# Patient Record
Sex: Male | Born: 1935 | Race: Black or African American | Hispanic: No | Marital: Married | Smoking: Former smoker
Health system: Southern US, Community
[De-identification: ages and names within clinical notes are randomized; demographics above are authoritative.]

## PROBLEM LIST (undated history)

## (undated) DIAGNOSIS — N289 Disorder of kidney and ureter, unspecified: Secondary | ICD-10-CM

## (undated) DIAGNOSIS — D573 Sickle-cell trait: Secondary | ICD-10-CM

## (undated) DIAGNOSIS — G4730 Sleep apnea, unspecified: Secondary | ICD-10-CM

## (undated) DIAGNOSIS — E7800 Pure hypercholesterolemia, unspecified: Secondary | ICD-10-CM

## (undated) DIAGNOSIS — Z950 Presence of cardiac pacemaker: Secondary | ICD-10-CM

## (undated) DIAGNOSIS — I10 Essential (primary) hypertension: Secondary | ICD-10-CM

## (undated) DIAGNOSIS — I4891 Unspecified atrial fibrillation: Secondary | ICD-10-CM

## (undated) DIAGNOSIS — I499 Cardiac arrhythmia, unspecified: Secondary | ICD-10-CM

## (undated) DIAGNOSIS — E119 Type 2 diabetes mellitus without complications: Secondary | ICD-10-CM

## (undated) DIAGNOSIS — I442 Atrioventricular block, complete: Secondary | ICD-10-CM

## (undated) DIAGNOSIS — J45909 Unspecified asthma, uncomplicated: Secondary | ICD-10-CM

## (undated) HISTORY — DX: Type 2 diabetes mellitus without complications: E11.9

## (undated) HISTORY — DX: Sickle-cell trait: D57.3

## (undated) HISTORY — DX: Pure hypercholesterolemia, unspecified: E78.00

## (undated) HISTORY — DX: Cardiac arrhythmia, unspecified: I49.9

## (undated) HISTORY — DX: Essential (primary) hypertension: I10

## (undated) HISTORY — DX: Sleep apnea, unspecified: G47.30

## (undated) HISTORY — DX: Disorder of kidney and ureter, unspecified: N28.9

## (undated) HISTORY — DX: Unspecified asthma, uncomplicated: J45.909

---

## 1994-07-14 HISTORY — PX: OTHER SURGICAL HISTORY: SHX169

## 1995-03-14 HISTORY — PX: OTHER SURGICAL HISTORY: SHX169

## 1997-03-13 HISTORY — PX: PACEMAKER INSERTION: SHX728

## 1997-08-13 HISTORY — PX: PENILE PROSTHESIS IMPLANT: SHX240

## 1997-08-28 ENCOUNTER — Observation Stay (HOSPITAL_COMMUNITY): Admission: RE | Admit: 1997-08-28 | Discharge: 1997-08-29 | Payer: Self-pay | Admitting: Urology

## 1999-06-14 HISTORY — PX: OTHER SURGICAL HISTORY: SHX169

## 2000-04-16 ENCOUNTER — Encounter: Payer: Self-pay | Admitting: Internal Medicine

## 2000-04-16 ENCOUNTER — Encounter: Admission: RE | Admit: 2000-04-16 | Discharge: 2000-04-16 | Payer: Self-pay | Admitting: Internal Medicine

## 2003-12-21 ENCOUNTER — Ambulatory Visit: Payer: Self-pay

## 2004-04-19 ENCOUNTER — Ambulatory Visit: Payer: Self-pay | Admitting: Internal Medicine

## 2004-04-24 ENCOUNTER — Ambulatory Visit: Payer: Self-pay | Admitting: Internal Medicine

## 2005-07-18 ENCOUNTER — Ambulatory Visit: Payer: Self-pay | Admitting: Internal Medicine

## 2006-08-03 ENCOUNTER — Ambulatory Visit: Payer: Self-pay | Admitting: Internal Medicine

## 2006-11-09 ENCOUNTER — Ambulatory Visit: Payer: Self-pay

## 2006-11-14 HISTORY — PX: OTHER SURGICAL HISTORY: SHX169

## 2006-12-02 ENCOUNTER — Ambulatory Visit: Payer: Self-pay | Admitting: Internal Medicine

## 2006-12-02 LAB — CONVERTED CEMR LAB
BUN: 43 mg/dL — ABNORMAL HIGH (ref 6–23)
Basophils Absolute: 0 10*3/uL (ref 0.0–0.1)
Basophils Relative: 0.7 % (ref 0.0–1.0)
CO2: 25 meq/L (ref 19–32)
Calcium: 9.6 mg/dL (ref 8.4–10.5)
Chloride: 106 meq/L (ref 96–112)
Creatinine, Ser: 1.5 mg/dL (ref 0.4–1.5)
Eosinophils Absolute: 0.2 10*3/uL (ref 0.0–0.6)
Eosinophils Relative: 5 % (ref 0.0–5.0)
GFR calc Af Amer: 59 mL/min
GFR calc non Af Amer: 49 mL/min
Glucose, Bld: 119 mg/dL — ABNORMAL HIGH (ref 70–99)
HCT: 36.5 % — ABNORMAL LOW (ref 39.0–52.0)
Hemoglobin: 11.9 g/dL — ABNORMAL LOW (ref 13.0–17.0)
INR: 2.5 — ABNORMAL HIGH (ref 0.8–1.0)
Lymphocytes Relative: 26.6 % (ref 12.0–46.0)
MCHC: 32.7 g/dL (ref 30.0–36.0)
MCV: 72.2 fL — ABNORMAL LOW (ref 78.0–100.0)
Monocytes Absolute: 0.5 10*3/uL (ref 0.2–0.7)
Monocytes Relative: 11.5 % — ABNORMAL HIGH (ref 3.0–11.0)
Neutro Abs: 2.7 10*3/uL (ref 1.4–7.7)
Neutrophils Relative %: 56.2 % (ref 43.0–77.0)
Platelets: 147 10*3/uL — ABNORMAL LOW (ref 150–400)
Potassium: 3.8 meq/L (ref 3.5–5.1)
Prothrombin Time: 19.8 s — ABNORMAL HIGH (ref 10.9–13.3)
RBC: 5.06 M/uL (ref 4.22–5.81)
RDW: 15.6 % — ABNORMAL HIGH (ref 11.5–14.6)
Sodium: 140 meq/L (ref 135–145)
WBC: 4.6 10*3/uL (ref 4.5–10.5)
aPTT: 40.5 s — ABNORMAL HIGH (ref 21.7–29.8)

## 2006-12-08 ENCOUNTER — Ambulatory Visit (HOSPITAL_COMMUNITY): Admission: RE | Admit: 2006-12-08 | Discharge: 2006-12-08 | Payer: Self-pay | Admitting: Internal Medicine

## 2006-12-08 ENCOUNTER — Ambulatory Visit: Payer: Self-pay | Admitting: Internal Medicine

## 2006-12-23 ENCOUNTER — Ambulatory Visit: Payer: Self-pay

## 2008-03-13 HISTORY — PX: CIRCUMCISION: SUR203

## 2008-05-15 ENCOUNTER — Ambulatory Visit (HOSPITAL_COMMUNITY): Admission: RE | Admit: 2008-05-15 | Discharge: 2008-05-15 | Payer: Self-pay | Admitting: Urology

## 2010-04-23 LAB — PROTIME-INR
INR: 1.2 (ref 0.00–1.49)
Prothrombin Time: 15.3 seconds — ABNORMAL HIGH (ref 11.6–15.2)

## 2010-04-23 LAB — APTT: aPTT: 32 seconds (ref 24–37)

## 2010-04-24 LAB — BASIC METABOLIC PANEL
BUN: 28 mg/dL — ABNORMAL HIGH (ref 6–23)
Chloride: 109 mEq/L (ref 96–112)
Glucose, Bld: 156 mg/dL — ABNORMAL HIGH (ref 70–99)
Potassium: 3.7 mEq/L (ref 3.5–5.1)

## 2010-04-24 LAB — HEMOGLOBIN AND HEMATOCRIT, BLOOD: Hemoglobin: 11.7 g/dL — ABNORMAL LOW (ref 13.0–17.0)

## 2010-05-28 NOTE — Letter (Signed)
August 03, 2006    Abner Greenspan, M.D.  [address]  [place], [place]  [address]   RE:  Bradley Rasmussen, Bradley Rasmussen  MRN:  [id number]  /  DOB:  [birth year]   Dear [name]:   I hope this letter finds you and your family well.  [name] comes in  today, he is status post pacemaker implantation for a complete heart  block in the setting ofpermanent atrial fibrillation.  He has no  complaints of chest pain or shortness of breath. Despite the notes from  the nurses saying shortness of breath with exertion, he at least denies  that with me.  He did have an episode of transient right upper shoulder  discomfort.  He thinks this is vaguely similar to something that he has  had before.  It was quite brief.  He does not know what he was doing  when it happened.   His medications have been recently adjusted with a discontinuation of  amlodipine because of edema.  He is now on felodipine, lisinopril,  atenolol, allopurinol, furosemide, __________ , statin and Coumadin.   On examination is blood pressure was 160/96, his pulse was 65.  LUNGS:  Clear.  HEART:  Sounds were regular.  EXTREMITIES:  Were without edema.   Interrogation of his St. Jude pulse generator demonstrates that he has  an estimated longevity of about 3 months.  His ventricular ventricular  capture threshold is 1 V at 0.4with an RV impedance of 561.  There is no  intrinsic ventricular rhythm.   IMPRESSION:  1. Atrial fibrillation.  2. Complete heart block with no intrinsic ventricular rhythm.  3. Status post pacer for number 1 and number 2.  4. Systolic hypertension.   As [name] is going to see you in the next couple of days, we will not  make any adjustments on his blood pressure medications today.  We will  plan to see him again in about 3 months as he approaches device  generator replacement.  We were also reviewing with him whether he would  like to have his device changed here or in [place], with the [place]  [facility] and  we have encouraged him to do whatever it is that he  feels most comfortable doing.   Thanks you for allowing [place] to participate in his care.    Sincerely,      Duke Salvia, MD, [facility]  Electronically Signed    SCK/MedQ  DD: 08/03/2006  DT: 08/03/2006  Job #: [id number]   CC:    Florian Buff, MD

## 2010-05-28 NOTE — Op Note (Signed)
NAME:  Bradley Rasmussen, Bradley Rasmussen                  ACCOUNT NO.:  [id number]   MEDICAL RECORD NO.:  [id number]          PATIENT TYPE:  OIB   LOCATION:  2854                         FACILITY:  [facility]   PHYSICIAN:  Duke Salvia, MD, FACCDATE OF BIRTH:  Jun 06, 1931   DATE OF PROCEDURE:  12/08/2006  DATE OF DISCHARGE:                               OPERATIVE REPORT   PREOPERATIVE DIAGNOSIS:  Bradycardia prone atrial fibrillation,  pacemaker end of life.   POSTOPERATIVE DIAGNOSIS:  Bradycardia prone atrial fibrillation,  pacemaker end of life.   PROCEDURE:  Explantation of a previously implanted pacemaker, insertion  of a new single chamber pulse generator.   Following obtaining informed consent, Mr. Holtsclaw was brought to the  catheterization laboratory and placed on the fluoroscopic table in  supine position.  After routine prep and drape, lidocaine was  infiltrated in the line of the previous incision and an incision was  made and carried down to layer of the device pocket using sharp  dissection and electrocautery.  The pocket was opened.  The previously  implanted device was freed up and explanted.  Interrogation of the  chronic RV lead demonstrated an RV amplitude of 14.7 with impedance of  578, a threshold of 0.6 V at 0.5 milliseconds.  Current threshold 0.4  MA.  The lead was a 1346 Pacesetter lead serial number [id number].  This  lead was then attached to a St. Jude Zephyr XL pulse generator model  [id number] serial number [id number].  Pacing was identified.  The pocket was  copiously irrigated with antibiotic containing saline solution.  We  noted that there was a lot of antibiotic on the floor and not quite sure  where the leak was.  The wound was then closed in two layers in normal  fashion.  Wound was washed and dried and a benzoin Steri-Strip dressing  was applied.  Needle counts, sponge counts and instrument counts were  correct at the end of the procedure according to staff.  The patient  tolerated  the procedure without apparent complication.      Duke Salvia, MD, [facility]  Electronically Signed     SCK/MEDQ  D:  12/08/2006  T:  12/08/2006  Job:  [id number]

## 2010-05-28 NOTE — Op Note (Signed)
NAME:  Bradley Rasmussen, Bradley Rasmussen                  ACCOUNT NO.:  [id number]   MEDICAL RECORD NO.:  [id number]          PATIENT TYPE:  AMB   LOCATION:  DAY                          FACILITY:  [facility]   PHYSICIAN:  Sigmund I. Patsi Sears, M.D.DATE OF BIRTH:  1931-02-04   DATE OF PROCEDURE:  05/15/2008  DATE OF DISCHARGE:                               OPERATIVE REPORT   PREOPERATIVE DIAGNOSIS:  Severe phimosis.   POSTOPERATIVE DIAGNOSIS:  Severe phimosis.   OPERATION:  Circumcision.   SURGEON:  Tannenbaum   ANESTHESIA:  General LMA.   PREPARATION:  After appropriate preanesthesia, the patient is brought to  the operating room, placed on the operating room table in the dorsal  supine position where general LMA anesthesia was induced.  He remained  in the supine position where penis was prepped with Betadine solution  and draped in usual fashion.   REVIEW OF HISTORY:  This 77 year old male has history of BPH, and ED  (inflatable penile prosthesis, 1999), has been seen for severe tight  foreskin, which cracks when he retracts it over the glans.  This has  been steadily getting worse since October 2009.  Note that the patient  has a history of thalassemia trait, atrial fibrillation, COPD, diabetes,  gout, and sickle cell trait.   The patient has been on Lovenox bridging by Dr. Yetta Flock.   PROCEDURE:  An outline is made of the very tight foreskin, and  circumcising incision was made around the glans.  Pericoronal marking is  accomplished, and pericoronal incision was made.  Dissection of the very  tight and cracked foreskin was then accomplished.  The right four corner  repair was accomplished with 4-0 Monocryl suture.  Each quadrant is then  closed with 4-0 Monocryl suture.  Sterile dressing applied, and  following this, the patient was awakened and recovered in good  condition.      Sigmund I. Patsi Sears, M.D.  Electronically Signed     SIT/MEDQ  D:  05/15/2008  T:  05/15/2008  Job:   [id number]   cc:   Abner Greenspan, MD  [address]  [place], [place]

## 2010-05-28 NOTE — Discharge Summary (Signed)
NAME:  Bradley Rasmussen, Bradley Rasmussen                  ACCOUNT NO.:  [id number]   MEDICAL RECORD NO.:  [id number]          PATIENT TYPE:  OIB   LOCATION:  2854                         FACILITY:  [facility]   PHYSICIAN:  Duke Salvia, MD, FACCDATE OF BIRTH:  July 29, 1931   DATE OF ADMISSION:  12/08/2006  DATE OF DISCHARGE:  12/08/2006                               DISCHARGE SUMMARY   ALLERGIES:  AMOXICILLIN.   Dictation greater than 25 minutes.   FINAL DIAGNOSES:  1. Pacemaker at elective replacement indicator.  2. Explant of St. Librarian, academic with implant of St. Jude      Zephyr XL SR  single-chamber pacemaker.   SECONDARY DIAGNOSES:  1. Atrial fibrillation, chronic Coumadin therapy.  2. Complete heart block.  3. Nonischemic cardiomyopathy.  4. Left heart catheterization in 1997  5. New York Heart Association class II-III.  6. Chronic systolic congestive heart failure.  7. Hypertension.  8. Gout.  9. Right bundle branch block.  10.Borderline diabetes.   PROCEDURE:  December 08, 2006, explantation of existing single-chamber  pacemaker with implant of St. Jude Zephyr XL SR single-chamber  pacemaker, Dr. Sherryl Manges.  The patient had no post-procedural  complications and discharging the same day.  No hematoma.   DISCHARGE INSTRUCTIONS:  1. Remove the bandage on Wednesday, December 09, 2006 and leave the      incision open to the air.  2. He is to keep the incision dry for the next 7 days and to sponge      bathe until Tuesday, December 15, 2006.  3. He has a follow up appointment with [facility] on December 09, 2006, [place].  4. To see the pacer clinic Wednesday December 23, 2006 at 9:40.   DISCHARGE MEDICATIONS:  1. Lisinopril 40 mg daily.  2. Felodipine 5 mg daily.  3. Allopurinol 300 mg daily.  4. Furosemide 40 mg daily.  5. Enteric-coated aspirin 81 mg daily.  6. Doxazosin 8 mg daily.  7. Simvastatin 20 mg daily at bedtime.  8. Carvedilol 12.5 mg  twice daily.  9. Allegra 180 mg daily.  10.Fluticasone nasal spray as before this admission.  11.Coumadin as before this hospital visit   BRIEF HISTORY:  Mr. Churchill is a 77 year old male.  He has atrial  fibrillation with complete heart block.  He has no intrinsic ventricular  rhythm.  He has some dizziness with ambulation, but his dyspnea is  fairly stable.  He can climb 1 flight of stairs, but has to rest after  that.  Walking on level ground is not a problem for him.   Mr. Tippett will need pacemaker generator replacement.  The benefits and  risks have been reviewed, and the patient wishes to proceed.  He will  present electively   HOSPITAL COURSE:  The patient presents December 08, 2006.  He underwent  explantation of existing St. Jude device with implantation of the St.  Jude Zephyr XL SR device.  The patient had post-procedural interrogation  of the device with values within normal limits.  LABORATORY DATA:  White cells 4.6, hemoglobin 11.9, hematocrit 36.5,  platelets of 147, pro-time this admission is 21.0, INR 1.8.  Serum  electrolytes:  Sodium is 140, potassium 3.8, chloride 106, carbonate 25,  glucose 119, BUN is 43, creatinine 1.5.      Maple Mirza, [place]      Duke Salvia, MD, [facility]  Electronically Signed    GM/MEDQ  D:  12/08/2006  T:  12/08/2006  Job:  [id number]   cc:   Duke Salvia, MD, [facility]

## 2010-05-28 NOTE — Assessment & Plan Note (Signed)
[facility]                         ELECTROPHYSIOLOGY OFFICE NOTE   ARI, BERNABEI                           MRN:          [id number]  DATE:12/02/2006                            DOB:          22-Aug-1931    Mr. Bradley Rasmussen was seen yesterday.  He has atrial fibrillation with complete  heart block and no intrinsic ventricular rhythm.  His shortness of  breath is stable but he is having some dizziness with ambulation.   MEDICATIONS:  1. Carvedilol 12.5 b.i.d.  2. Simvastatin.  3. Warfarin  4. Lisinopril 40.  5. Felodipine 5.  6. Furosemide 40.  7. Aspirin.   PHYSICAL EXAMINATION:  VITAL SIGNS:  His blood pressure was pretty well  controlled at 143/85.  His pulse was 85.  LUNGS:  Clear.  HEART:  Sounds were regular.  EXTREMITIES:  Without edema.  SKIN:  Warm and dry.   Interrogation of his St. Jude pulse generator demonstrates that he is  very closely approaching ERI.  His battery voltage is 2.52.  His  impedance is greater than 23 kilo ohms.  His ventricular lead impedance  is 612.  Threshold was 1 volt at 0.4.  He does have an intrinsic rhythm  at about 30 beats per minute.   IMPRESSION:  1. High grade heart block.  2. Status post pacemaker for the above, now approaching elective      replacement interval.  3. Hypertension.  4. Chronic atrial fibrillation.   Mr. Bradley Rasmussen will need to undergo pacemaker generator replacement.  We have  reviewed the potential benefits as well as the potential risks,  including but not limited to, infection.  Given the fact that his  underlying rate will be at 30, we will plan not to place a temporary  transvenous pacer.     Duke Salvia, MD, [facility]  Electronically Signed   SCK/MedQ  DD: 12/03/2006  DT: 12/03/2006  Job #: [id number]

## 2010-10-22 LAB — PROTIME-INR: INR: 1.8 — ABNORMAL HIGH

## 2011-10-14 LAB — PULMONARY FUNCTION TEST

## 2013-01-31 ENCOUNTER — Ambulatory Visit (INDEPENDENT_AMBULATORY_CARE_PROVIDER_SITE_OTHER)
Admission: RE | Admit: 2013-01-31 | Discharge: 2013-01-31 | Disposition: A | Discharge status: Home / Self Care | Payer: Medicare HMO | Source: Ambulatory Visit | Attending: Pulmonary Disease | Admitting: Pulmonary Disease

## 2013-01-31 ENCOUNTER — Other Ambulatory Visit (INDEPENDENT_AMBULATORY_CARE_PROVIDER_SITE_OTHER): Payer: Medicare HMO

## 2013-01-31 ENCOUNTER — Ambulatory Visit (INDEPENDENT_AMBULATORY_CARE_PROVIDER_SITE_OTHER): Payer: Medicare HMO | Admitting: Pulmonary Disease

## 2013-01-31 ENCOUNTER — Encounter: Payer: Self-pay | Admitting: Pulmonary Disease

## 2013-01-31 VITALS — BP 122/68 | HR 75 | Ht 69.0 in | Wt 221.0 lb

## 2013-01-31 DIAGNOSIS — I509 Heart failure, unspecified: Secondary | ICD-10-CM

## 2013-01-31 DIAGNOSIS — R0602 Shortness of breath: Secondary | ICD-10-CM

## 2013-01-31 DIAGNOSIS — D649 Anemia, unspecified: Secondary | ICD-10-CM | POA: Insufficient documentation

## 2013-01-31 LAB — CBC WITH DIFFERENTIAL/PLATELET
BASOS PCT: 0.5 % (ref 0.0–3.0)
Basophils Absolute: 0 10*3/uL (ref 0.0–0.1)
EOS ABS: 0.1 10*3/uL (ref 0.0–0.7)
EOS PCT: 3.4 % (ref 0.0–5.0)
HEMATOCRIT: 35.4 % — AB (ref 39.0–52.0)
Hemoglobin: 11.3 g/dL — ABNORMAL LOW (ref 13.0–17.0)
LYMPHS ABS: 0.9 10*3/uL (ref 0.7–4.0)
Lymphocytes Relative: 23.7 % (ref 12.0–46.0)
MCHC: 31.8 g/dL (ref 30.0–36.0)
MCV: 72.1 fl — AB (ref 78.0–100.0)
MONO ABS: 0.5 10*3/uL (ref 0.1–1.0)
Monocytes Relative: 13.1 % — ABNORMAL HIGH (ref 3.0–12.0)
NEUTROS PCT: 59.3 % (ref 43.0–77.0)
Neutro Abs: 2.2 10*3/uL (ref 1.4–7.7)
PLATELETS: 98 10*3/uL — AB (ref 150.0–400.0)
RBC: 4.91 Mil/uL (ref 4.22–5.81)
RDW: 14.3 % (ref 11.5–14.6)
WBC: 3.8 10*3/uL — AB (ref 4.5–10.5)

## 2013-01-31 NOTE — Assessment & Plan Note (Signed)
I need some details here.    Plan: -get recent cardiologist notes -get recent echo and stress test results

## 2013-01-31 NOTE — Assessment & Plan Note (Signed)
He has thalassemia of some sort and sickle trait apparently.  Certainly anemia can contribute to his dyspnea.  Plan: -check CBC

## 2013-01-31 NOTE — Patient Instructions (Addendum)
We will set up lung function tests We will request records from your Cardiologist's office (notes and echo), and Dr. Gaylan Gerold office in [facility] (notes and pulmonary function testing) Use the Symbicort two puffs twice a day no matter how you feel We will see you back in 3-4 weeks or sooner if needed

## 2013-01-31 NOTE — Assessment & Plan Note (Signed)
This is a former smoker who carries a diagnosis of asthma who is here to see [place] today for shortness of breath.  I explained to him that the ddx is broad and includes COPD, his CHF, anemia and deconditioning.  We really need to gather more information about his comorbid illnesses to help him the most.  However, he was wheezing today on exam and currently doesn't take anything other than albuterol for his asthma, so we need to start a controller inhaler for him.  Plan: -add Symbicort bid -CBC -request recent records from cardiologist and echo results -request recent pulmonary records and PFTs

## 2013-01-31 NOTE — Progress Notes (Signed)
Subjective:    Patient ID: Bradley Rasmussen, male    DOB: 1931/06/14, 80 y.o.   MRN: [id number]  HPI  Bradley Rasmussen is here to see me becaues of shortness of breath.  He states that he has been having a lot of wheezing for a few years.  He saw Dr. Welford Roche in [facility] for COPD vs asthma.  Dr. Welford Roche (pulmonary) felt that he had asthma and severe bronchitis.  He was prescribed an inhaler named proAir as well as some nebulized medicines.  He tried using Singulair but he had to stop it because it made him sick to his stomach.  Any dust in the environment will make him wheeze and sneeze, but not much shortness of breath.  He says that he exercises around two hours a day with exercise but the shortness of breath isn't too bad.   He had a normal adult life up until 4 years ago.  Before that he had no respiratory problems. He worked for [employer] for years and he said that the environment was pretty dust.  He was exposed to a mix of some sort that was made into a mold that made the battery.  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  He never had problems with dyspnea then.  He now has shortness of breath with any walking, running, or heavy lifting.  He thinks that none of the inhalers ever helped.  He smoked 1/2 pack per day for 45 years and quit in 1990.  He has a pacemaker which was placed for an irregular heart beat.  He has been told he has congestive heart failure.  He sees Dr. Juliette Mangle in [place].    Past Medical History  Diagnosis Date  . High blood pressure   . Irregular heart rhythm   . Asthma   . Diabetes mellitus   . High cholesterol   . Kidney disease   . Sleep apnea   . Sickle cell trait      Family History  Problem Relation Age of Onset  . Heart disease Father   . Heart disease Brother      History   Social History  . Marital Status: Married    Spouse Name: N/A    Number of Children: N/A  . Years of Education: N/A   Occupational History  . Not on  file.   Social History Main Topics  . Smoking status: Former Smoker -- 0.50 packs/day for 40 years    Types: Cigarettes    Quit date: 01/14/1988  . Smokeless tobacco: Never Used  . Alcohol Use: No  . Drug Use: Not on file  . Sexual Activity: Not on file   Other Topics Concern  . Not on file   Social History Narrative  . No narrative on file     Allergies  Allergen Reactions  . Amoxicillin     Itch,rash     No outpatient prescriptions prior to visit.   No facility-administered medications prior to visit.      Review of Systems  Constitutional: Negative for fever and unexpected weight change.  HENT: Positive for rhinorrhea, sneezing and sore throat. Negative for congestion, dental problem, ear pain, nosebleeds, postnasal drip, sinus pressure and trouble swallowing.   Eyes: Negative for redness and itching.  Respiratory: Positive for cough, shortness of breath and wheezing. Negative for chest tightness.   Cardiovascular: Negative for palpitations and leg swelling.  Gastrointestinal: Negative for nausea and vomiting.  Genitourinary: Negative for dysuria.  Musculoskeletal: Negative for joint swelling.  Skin: Negative for rash.  Neurological: Negative for headaches.  Hematological: Does not bruise/bleed easily.  Psychiatric/Behavioral: Negative for dysphoric mood. The patient is not nervous/anxious.        Objective:   Physical Exam  Filed Vitals:   01/31/13 1633  BP: 122/68  Pulse: 75  Height: 5\' 9"  (1.753 m)  Weight: 221 lb (100.245 kg)  SpO2: 100%   Gen: well appearing, no acute distress HEENT: NCAT, PERRL, EOMi, OP clear, neck supple without masses PULM: Wheezing bilaterally CV: RRR, no mgr, no JVD AB: BS+, soft, nontender, no hsm Ext: warm, no edema, no clubbing, no cyanosis Derm: no rash or skin breakdown Neuro: A&Ox4, CN II-XII intact, strength 5/5 in all 4 extremities       Assessment & Plan:   Shortness of breath This is a former smoker who  carries a diagnosis of asthma who is here to see [place] today for shortness of breath.  I explained to him that the ddx is broad and includes COPD, his CHF, anemia and deconditioning.  We really need to gather more information about his comorbid illnesses to help him the most.  However, he was wheezing today on exam and currently doesn't take anything other than albuterol for his asthma, so we need to start a controller inhaler for him.  Plan: -add Symbicort bid -CBC -request recent records from cardiologist and echo results -request recent pulmonary records and PFTs  CHF (congestive heart failure) I need some details here.    Plan: -get recent cardiologist notes -get recent echo and stress test results  Anemia He has thalassemia of some sort and sickle trait apparently.  Certainly anemia can contribute to his dyspnea.  Plan: -check CBC   Updated Medication List Outpatient Encounter Prescriptions as of 01/31/2013  Medication Sig  . allopurinol (ZYLOPRIM) 100 MG tablet Take 100 mg by mouth daily.  . carvedilol (COREG) 12.5 MG tablet Take 12.5 mg by mouth 2 (two) times daily with a meal.  . doxazosin (CARDURA) 8 MG tablet Take 8 mg by mouth daily.  . finasteride (PROSCAR) 5 MG tablet Take 5 mg by mouth daily.  . fluticasone (FLOVENT HFA) 110 MCG/ACT inhaler Inhale 2 puffs into the lungs 2 (two) times daily as needed.  . furosemide (LASIX) 40 MG tablet Take 40 mg by mouth 2 (two) times daily.  Marland Kitchen glipiZIDE (GLUCOTROL) 5 MG tablet Take by mouth daily before breakfast.  . potassium chloride SA (K-DUR,KLOR-CON) 20 MEQ tablet Take 20 mEq by mouth 2 (two) times daily.  . pravastatin (PRAVACHOL) 80 MG tablet Take 80 mg by mouth daily.  Marland Kitchen warfarin (COUMADIN) 1 MG tablet Take 1 mg by mouth daily. As directed

## 2013-02-01 LAB — IGE: IgE (Immunoglobulin E), Serum: 20.6 IU/mL (ref 0.0–180.0)

## 2013-02-02 ENCOUNTER — Telehealth: Payer: Self-pay

## 2013-02-02 NOTE — Telephone Encounter (Signed)
Spoke with pt, he is aware of cxr results.  Nothing further needed.

## 2013-02-02 NOTE — Telephone Encounter (Signed)
Message copied by Len Blalock on Wed Feb 02, 2013  3:40 PM ------      Message from: Simonne Maffucci B      Created: Tue Feb 01, 2013  9:50 PM       A,            Please let him know that his CXR showed an enlarged heart but his lungs were normal            Thanks      B ------

## 2013-02-28 ENCOUNTER — Ambulatory Visit: Payer: Medicare HMO | Admitting: Pulmonary Disease

## 2013-03-07 ENCOUNTER — Encounter: Payer: Self-pay | Admitting: Pulmonary Disease

## 2013-03-07 ENCOUNTER — Ambulatory Visit (INDEPENDENT_AMBULATORY_CARE_PROVIDER_SITE_OTHER): Payer: Commercial Managed Care - HMO | Admitting: Pulmonary Disease

## 2013-03-07 VITALS — BP 116/72 | HR 74 | Ht 69.0 in | Wt 207.2 lb

## 2013-03-07 DIAGNOSIS — R0602 Shortness of breath: Secondary | ICD-10-CM

## 2013-03-07 NOTE — Patient Instructions (Signed)
Use Neil Med rinses with distilled water at least twice per day using the instructions on the package. 1/2 hour after using the [facility] Med rinse, use Nasonex two puffs in each nostril once per day. Use chlortrimeton and an over the counter decongestant (phenylephrine) as needed for the cough. We will request records from Dr. Gaylan Gerold office again We will see you back in 6 months or sooner if needed

## 2013-03-07 NOTE — Progress Notes (Signed)
   Subjective:    Patient ID: Bradley Rasmussen, male    DOB: 09/22/1931, 80 y.o.   MRN: [id number] Synopsis: 80 y/o male with asthma referred to [facility] in 01/2013.  HPI  03/07/2013 ROV> Bradley Rasmussen didn't think that the Symbicort made much of a difference in his breathing.  That said, he really hasn't had much dyspnea.  Right now he feels like he might have a cold since he has been sneezing and coughing up yellow mucus.  He has been treated with antibiotics recently and they didn't help much.  He continues to have sinus congestion and sinus drainage, but not much headache.  He is not coughing much.  He occasionally has nauseated.  He doesn't have much cough. He is taking flonase and mucinex which help some.    Past Medical History  Diagnosis Date  . High blood pressure   . Irregular heart rhythm   . Asthma   . Diabetes mellitus   . High cholesterol   . Kidney disease   . Sleep apnea   . Sickle cell trait      Review of Systems     Objective:   Physical Exam Filed Vitals:   03/07/13 1439  BP: 116/72  Pulse: 74  Height: 5\' 9"  (1.753 m)  Weight: 207 lb 3.2 oz (93.985 kg)  SpO2: 99%  RA  Gen: well appearing, no acute distress HEENT: NCAT, EOMi, OP clear PULM: CTA B CV: RRR, no mgr, no JVD AB: BS+, soft, nontender, no hsm Ext: warm, no edema, no clubbing, no cyanosis Derm: no rash or skin breakdown       Assessment & Plan:   Shortness of breath Mr. Delay notes a history of asthma, but today he tells me that he doesn't really have a problem with shortness of breath.  He didn't see any difference in his breathing with the Symbicort last month.   He notes mild wheezing and chest tightness on occasion, but he doesn't need much albuterol.  So he either has mild intermittent or mild persistent asthma.  Plan: -request PFTs again from his previous pulmonologist -hold off on long acting bronchodilators or inhaled steroids for now considering minimal symptoms -continue prn  albuterol -f/u 6 months or sooner if needed    Updated Medication List Outpatient Encounter Prescriptions as of 03/07/2013  Medication Sig  . allopurinol (ZYLOPRIM) 100 MG tablet Take 100 mg by mouth daily.  . carvedilol (COREG) 12.5 MG tablet Take 12.5 mg by mouth 2 (two) times daily with a meal.  . doxazosin (CARDURA) 8 MG tablet Take 8 mg by mouth daily.  . finasteride (PROSCAR) 5 MG tablet Take 5 mg by mouth daily.  . fluticasone (FLOVENT HFA) 110 MCG/ACT inhaler Inhale 2 puffs into the lungs 2 (two) times daily as needed.  . furosemide (LASIX) 40 MG tablet Take 40 mg by mouth 2 (two) times daily.  Marland Kitchen glipiZIDE (GLUCOTROL) 5 MG tablet Take by mouth daily before breakfast.  . potassium chloride SA (K-DUR,KLOR-CON) 20 MEQ tablet Take 20 mEq by mouth 2 (two) times daily.  . pravastatin (PRAVACHOL) 80 MG tablet Take 80 mg by mouth daily.  Marland Kitchen warfarin (COUMADIN) 1 MG tablet Take 1 mg by mouth daily. As directed

## 2013-03-07 NOTE — Assessment & Plan Note (Signed)
Bradley Rasmussen notes a history of asthma, but today he tells me that he doesn't really have a problem with shortness of breath.  He didn't see any difference in his breathing with the Symbicort last month.   He notes mild wheezing and chest tightness on occasion, but he doesn't need much albuterol.  So he either has mild intermittent or mild persistent asthma.  Plan: -request PFTs again from his previous pulmonologist -hold off on long acting bronchodilators or inhaled steroids for now considering minimal symptoms -continue prn albuterol -f/u 6 months or sooner if needed

## 2013-03-23 ENCOUNTER — Encounter: Payer: Self-pay | Admitting: Pulmonary Disease

## 2013-03-23 DIAGNOSIS — Z9989 Dependence on other enabling machines and devices: Secondary | ICD-10-CM

## 2013-03-23 DIAGNOSIS — G4733 Obstructive sleep apnea (adult) (pediatric): Secondary | ICD-10-CM | POA: Insufficient documentation

## 2013-09-12 ENCOUNTER — Encounter (INDEPENDENT_AMBULATORY_CARE_PROVIDER_SITE_OTHER): Payer: Self-pay

## 2013-09-12 ENCOUNTER — Ambulatory Visit (INDEPENDENT_AMBULATORY_CARE_PROVIDER_SITE_OTHER): Payer: Commercial Managed Care - HMO | Admitting: Pulmonary Disease

## 2013-09-12 ENCOUNTER — Encounter: Payer: Self-pay | Admitting: Pulmonary Disease

## 2013-09-12 VITALS — BP 122/68 | HR 85 | Ht 69.0 in | Wt 217.0 lb

## 2013-09-12 DIAGNOSIS — R0602 Shortness of breath: Secondary | ICD-10-CM

## 2013-09-12 DIAGNOSIS — Z9989 Dependence on other enabling machines and devices: Secondary | ICD-10-CM

## 2013-09-12 DIAGNOSIS — G4733 Obstructive sleep apnea (adult) (pediatric): Secondary | ICD-10-CM

## 2013-09-12 MED ORDER — FLUTTER DEVI: Status: AC

## 2013-09-12 NOTE — Progress Notes (Signed)
Subjective:    Patient ID: Bradley Rasmussen, male    DOB: Jan 29, 1931, 80 y.o.   MRN: [id number] Synopsis: 80 y/o male with asthma referred to [facility] in 01/2013.  HPI   09/12/2013 ROV > Bradley Rasmussen is here to see me today because his CPAP machine needs some replacement parts but his currnt insurance provider doesn't work with the [place] that originally filled the device. He says taht he needs a new headgear and nose piece.  His breathing has been fine.  He has a lot chest congestion in the mornings.  He takes allegra from now and then.    Past Medical History  Diagnosis Date  . High blood pressure   . Irregular heart rhythm   . Asthma   . Diabetes mellitus   . High cholesterol   . Kidney disease   . Sleep apnea   . Sickle cell trait      Review of Systems      Objective:   Physical Exam  Filed Vitals:   09/12/13 1509  BP: 122/68  Pulse: 85  Height: 5\' 9"  (1.753 m)  Weight: 217 lb (98.431 kg)  SpO2: 100%  RA  Gen: well appearing, no acute distress HEENT: NCAT, EOMi, OP clear PULM: CTA B CV: RRR, no mgr, no JVD AB: BS+, soft, nontender, no hsm Ext: warm, no edema, no clubbing, no cyanosis Derm: no rash or skin breakdown       Assessment & Plan:   Shortness of breath This has been a stable interval for him.  He has some mucus production and occasional wheezing.  I recommended a flutter valve and mucinex.  OSA on CPAP This is his main reason for being here today.  He needs a new prescription for his CPAP because of insurance changes.  Plan: -We will request copies of his most recent polysomnogram and CPAP titration study and then send a prescription for CPAP to his new DME company    Updated Medication List Outpatient Encounter Prescriptions as of 09/12/2013  Medication Sig  . acetaminophen (TYLENOL) 500 MG tablet Take 500 mg by mouth every 6 (six) hours as needed.  Marland Kitchen allopurinol (ZYLOPRIM) 100 MG tablet Take 100 mg by mouth daily.  Marland Kitchen apixaban  (ELIQUIS) 2.5 MG TABS tablet Take 2.5 mg by mouth 2 (two) times daily.  . carvedilol (COREG) 12.5 MG tablet Take 12.5 mg by mouth 2 (two) times daily with a meal.  . Cholecalciferol (D-3-5) 5000 UNITS capsule Take 5,000 Units by mouth daily.  Marland Kitchen doxazosin (CARDURA) 8 MG tablet Take 8 mg by mouth daily.  . fexofenadine (ALLEGRA) 180 MG tablet Take 180 mg by mouth daily as needed for allergies or rhinitis.  . finasteride (PROSCAR) 5 MG tablet Take 5 mg by mouth daily.  . fluticasone (FLOVENT HFA) 110 MCG/ACT inhaler Inhale 2 puffs into the lungs 2 (two) times daily as needed.  . furosemide (LASIX) 40 MG tablet Take 40 mg by mouth 2 (two) times daily.  Marland Kitchen glipiZIDE (GLUCOTROL) 5 MG tablet Take by mouth daily before breakfast.  . potassium chloride SA (K-DUR,KLOR-CON) 20 MEQ tablet Take 20 mEq by mouth 2 (two) times daily.  . pravastatin (PRAVACHOL) 80 MG tablet Take 80 mg by mouth daily.  . psyllium (METAMUCIL) 58.6 % powder Take 1 packet by mouth daily as needed.  Marland Kitchen Respiratory Therapy Supplies (FLUTTER) DEVI Use as directed  . [DISCONTINUED] warfarin (COUMADIN) 1 MG tablet Take 1 mg by mouth daily. As directed

## 2013-09-12 NOTE — Assessment & Plan Note (Signed)
This has been a stable interval for him.  He has some mucus production and occasional wheezing.  I recommended a flutter valve and mucinex.

## 2013-09-12 NOTE — Patient Instructions (Signed)
Take generic guaifenesin 1-2 times per day (I like the pills, take 600mg  at a time) for the cough Use the flutter valve 3-4 times per day to help with cough We will request the copy of your polysomonogram from Dr. Gaylan Gerold office and will then fax a prescription to [facility] for CPAP We will see you back in 6 months or sooner if needed

## 2013-09-12 NOTE — Assessment & Plan Note (Signed)
This is his main reason for being here today.  He needs a new prescription for his CPAP because of insurance changes.  Plan: -We will request copies of his most recent polysomnogram and CPAP titration study and then send a prescription for CPAP to his new DME company

## 2013-10-07 ENCOUNTER — Encounter: Payer: Self-pay | Admitting: Pulmonary Disease

## 2013-10-07 NOTE — Telephone Encounter (Signed)
Called Monday AM

## 2013-10-10 NOTE — Telephone Encounter (Signed)
Caryl Pina, have ya'll ever received pt sleep study results. thanks

## 2013-10-12 ENCOUNTER — Encounter: Payer: Self-pay | Admitting: Pulmonary Disease

## 2013-10-12 NOTE — Telephone Encounter (Signed)
I do not have his sleep study and do not see it in the chart. Dr. Lake Bells do you have this?  If not, I'll resend request to Dr. Gaylan Gerold office for sleep study.

## 2013-10-14 NOTE — Telephone Encounter (Signed)
I have re-requested records from Dr. Gaylan Gerold office.  Will hold this message until I receive them.

## 2013-10-14 NOTE — Telephone Encounter (Signed)
This documentation has been received and placed in BQ look-at folder.

## 2013-10-20 NOTE — Telephone Encounter (Signed)
This is in BQ's look at folder.

## 2013-10-24 ENCOUNTER — Encounter: Payer: Self-pay | Admitting: Pulmonary Disease

## 2013-10-25 ENCOUNTER — Telehealth: Payer: Self-pay

## 2013-10-25 DIAGNOSIS — Z9989 Dependence on other enabling machines and devices: Principal | ICD-10-CM

## 2013-10-25 DIAGNOSIS — G4733 Obstructive sleep apnea (adult) (pediatric): Secondary | ICD-10-CM

## 2013-10-25 NOTE — Telephone Encounter (Signed)
Message copied by Len Blalock on Tue Oct 25, 2013  9:02 AM ------      Message from: Bradley Rasmussen      Created: Mon Oct 24, 2013  9:12 PM       A,      We need to send a new prescription for CPAP 13 cm of water. I have signed a copy of his 2013 polysomnogram and will be scanned into the computer.      [facility] okay by me.      Thanks,      Ruby Cola ------

## 2013-10-25 NOTE — Telephone Encounter (Signed)
Order placed. Nothing further needed. 

## 2013-10-28 ENCOUNTER — Other Ambulatory Visit: Payer: Self-pay

## 2014-07-10 ENCOUNTER — Other Ambulatory Visit: Payer: Self-pay

## 2014-07-31 ENCOUNTER — Ambulatory Visit: Payer: Commercial Managed Care - HMO | Admitting: Pulmonary Disease

## 2014-08-29 ENCOUNTER — Telehealth: Payer: Self-pay | Admitting: Pulmonary Disease

## 2014-08-29 ENCOUNTER — Ambulatory Visit (INDEPENDENT_AMBULATORY_CARE_PROVIDER_SITE_OTHER): Payer: Commercial Managed Care - HMO | Admitting: Pulmonary Disease

## 2014-08-29 ENCOUNTER — Other Ambulatory Visit (INDEPENDENT_AMBULATORY_CARE_PROVIDER_SITE_OTHER): Payer: Commercial Managed Care - HMO

## 2014-08-29 ENCOUNTER — Encounter: Payer: Self-pay | Admitting: Pulmonary Disease

## 2014-08-29 VITALS — BP 126/66 | HR 74

## 2014-08-29 DIAGNOSIS — R1900 Intra-abdominal and pelvic swelling, mass and lump, unspecified site: Secondary | ICD-10-CM

## 2014-08-29 DIAGNOSIS — R918 Other nonspecific abnormal finding of lung field: Secondary | ICD-10-CM

## 2014-08-29 DIAGNOSIS — R109 Unspecified abdominal pain: Secondary | ICD-10-CM | POA: Insufficient documentation

## 2014-08-29 DIAGNOSIS — R1013 Epigastric pain: Secondary | ICD-10-CM

## 2014-08-29 LAB — CBC WITH DIFFERENTIAL/PLATELET
BASOS PCT: 0.2 % (ref 0.0–3.0)
Basophils Absolute: 0 10*3/uL (ref 0.0–0.1)
EOS ABS: 0.2 10*3/uL (ref 0.0–0.7)
EOS PCT: 1.4 % (ref 0.0–5.0)
HEMATOCRIT: 39.3 % (ref 39.0–52.0)
HEMOGLOBIN: 12.6 g/dL — AB (ref 13.0–17.0)
Lymphs Abs: 0.7 10*3/uL (ref 0.7–4.0)
MCHC: 32 g/dL (ref 30.0–36.0)
MCV: 73.9 fl — AB (ref 78.0–100.0)
MONOS PCT: 8.6 % (ref 3.0–12.0)
Monocytes Absolute: 1.3 10*3/uL — ABNORMAL HIGH (ref 0.1–1.0)
NEUTROS ABS: 12.7 10*3/uL — AB (ref 1.4–7.7)
Neutrophils Relative %: 84.9 % — ABNORMAL HIGH (ref 43.0–77.0)
PLATELETS: 220 10*3/uL (ref 150.0–400.0)
RBC: 5.32 Mil/uL (ref 4.22–5.81)
RDW: 15.6 % — AB (ref 11.5–15.5)
WBC: 14.9 10*3/uL — AB (ref 4.0–10.5)

## 2014-08-29 LAB — BASIC METABOLIC PANEL
BUN: 60 mg/dL — AB (ref 6–23)
CO2: 28 mEq/L (ref 19–32)
CREATININE: 1.94 mg/dL — AB (ref 0.40–1.50)
Calcium: 10.5 mg/dL (ref 8.4–10.5)
Chloride: 95 mEq/L — ABNORMAL LOW (ref 96–112)
GFR: 42.97 mL/min — AB (ref >60.00)
GLUCOSE: 74 mg/dL (ref 70–99)
POTASSIUM: 4.6 meq/L (ref 3.5–5.1)
Sodium: 139 mEq/L (ref 135–145)

## 2014-08-29 MED ORDER — OXYCODONE HCL 5 MG PO CAPS: 5.0000 mg | ORAL_CAPSULE | Freq: Four times a day (QID) | ORAL | Status: DC | PRN

## 2014-08-29 MED ORDER — OXYCODONE HCL 5 MG PO TABS: 5.0000 mg | ORAL_TABLET | Freq: Four times a day (QID) | ORAL | Status: DC | PRN

## 2014-08-29 NOTE — Assessment & Plan Note (Signed)
I am concerned about his abdominal pain because I can feel a palpable abdominal mass on exam. This is worrisome for an abdominal malignancy considering the findings on his CT scan.  Plan: Oxycodone as needed for pain, advised not to take this and drive Colace to prevent constipation while on the oxycodone CT abdomen this week

## 2014-08-29 NOTE — Patient Instructions (Addendum)
Take the oxycodone every 6 hours as needed for pain, don't take this and drive; take it with a stool softener like colace Don't take your Eliquis this week We will call you with the results of the CT scan Don't eat breakfast the morning of the bronchoscopy (this Friday) We will see you back in 3 weeks or sooner if needed

## 2014-08-29 NOTE — Progress Notes (Signed)
Subjective:    Patient ID: Bradley Rasmussen, male    DOB: 1931-07-14, 81 y.o.   MRN: [id number] Synopsis: 81 y/o male with asthma referred to [facility] in 01/2013. He smoked in the past, 1 pack per day for 40 years. 2013 PFT [facility] > normal ratio, no obstruction; TLC 5.82L (94% pred), RV 3.16 L (122% pred), DLCO 19.3 (82% pred)  HPI  Chief Complaint  Patient presents with  . Follow-up    pt has new lung mass per Marco Collie.  Pt c/o increased weakness, stomach upset, shortness of breath.     Bradley Rasmussen hasn't seen me in over a year now.  He has seen by his primary care physician who evaluated him for weakness, cough, and mucus production.  He developed left sided pain which felt like bruising. He had a chest x-ray and a CT scan.  This all started about three months ago. He said that the pain was primarily in July, now that is better.  He has not been coughing up mucus, but he says that he was coughing up at least a quart of mucus every night about three months ago.  He had fevers then, but none since.  He continues to have some dyspnea, but the biggest issue is lack of energy and feeling generally weak.  He has lost weight, 20 pounds.     Past Medical History  Diagnosis Date  . High blood pressure   . Irregular heart rhythm   . Asthma   . Diabetes mellitus   . High cholesterol   . Kidney disease   . Sleep apnea   . Sickle cell trait      Review of Systems  Constitutional: Positive for fatigue. Negative for fever and chills.  HENT: Negative for postnasal drip, rhinorrhea and sinus pressure.   Respiratory: Positive for cough and shortness of breath. Negative for wheezing.   Cardiovascular: Negative for chest pain, palpitations and leg swelling.  Gastrointestinal: Positive for abdominal pain.       Objective:   Physical Exam Filed Vitals:   08/29/14 1041  BP: 126/66  Pulse: 74  SpO2: 98%  RA  Gen: well appearing, no acute distress HEENT: NCAT, EOMi, OP  clear PULM: Diminished left lower lobe, otherwise normal CV: RRR, no mgr, no JVD AB: BS+, soft, nontender, palpable epigastric mass Ext: warm, no edema, no clubbing, no cyanosis Derm: no rash or skin breakdown  July 2016 CT chest images personally reviewed showing dense consolidation versus mass in the left lung with mediastinal lymphadenopathy      Assessment & Plan:   Lung mass He has a new, large left lung mass seen on the CT chest from last month. Considering his smoking history this is very, very worrisome for lung cancer.  Plan: Bronchoscopy this week Hold blood thinners (Eliquis) now   Abdominal pain I am concerned about his abdominal pain because I can feel a palpable abdominal mass on exam. This is worrisome for an abdominal malignancy considering the findings on his CT scan.  Plan: Oxycodone as needed for pain, advised not to take this and drive Colace to prevent constipation while on the oxycodone CT abdomen this week    Updated Medication List Outpatient Encounter Prescriptions as of 08/29/2014  Medication Sig  . acetaminophen (TYLENOL) 500 MG tablet Take 500 mg by mouth every 6 (six) hours as needed.  Marland Kitchen allopurinol (ZYLOPRIM) 100 MG tablet Take 100 mg by mouth daily.  Marland Kitchen apixaban (ELIQUIS) 2.5  MG TABS tablet Take 2.5 mg by mouth 2 (two) times daily.  . carvedilol (COREG) 12.5 MG tablet Take 12.5 mg by mouth 2 (two) times daily with a meal.  . Cholecalciferol (D-3-5) 5000 UNITS capsule Take 5,000 Units by mouth daily.  Marland Kitchen doxazosin (CARDURA) 8 MG tablet Take 8 mg by mouth daily.  . finasteride (PROSCAR) 5 MG tablet Take 5 mg by mouth daily.  . furosemide (LASIX) 40 MG tablet Take 40 mg by mouth 2 (two) times daily.  Marland Kitchen glipiZIDE (GLUCOTROL) 5 MG tablet Take by mouth daily before breakfast.  . oxycodone (OXY-IR) 5 MG capsule Take 1 capsule (5 mg total) by mouth every 6 (six) hours as needed for pain.  . potassium chloride SA (K-DUR,KLOR-CON) 20 MEQ tablet Take 20  mEq by mouth 2 (two) times daily.  . pravastatin (PRAVACHOL) 80 MG tablet Take 80 mg by mouth daily.  . psyllium (METAMUCIL) 58.6 % powder Take 1 packet by mouth daily as needed.  Marland Kitchen Respiratory Therapy Supplies (FLUTTER) DEVI Use as directed  . [DISCONTINUED] fexofenadine (ALLEGRA) 180 MG tablet Take 180 mg by mouth daily as needed for allergies or rhinitis.  . [DISCONTINUED] fluticasone (FLOVENT HFA) 110 MCG/ACT inhaler Inhale 2 puffs into the lungs 2 (two) times daily as needed.   No facility-administered encounter medications on file as of 08/29/2014.

## 2014-08-29 NOTE — Telephone Encounter (Signed)
Spoke with Bradley Rasmussen  States that they do not offer to the capsules in the Oxy-IR '5mg'$ , will need to be called in as tablet form Patient lives in [place] and Rx was given to him this morning- This has been adjusted by Crystal at [facility]- will call if there is any issue with filling this verbally.  Med list updated to reflect this change. Nothing further needed.

## 2014-08-29 NOTE — Assessment & Plan Note (Addendum)
He has a new, large left lung mass seen on the CT chest from last month. Considering his smoking history this is very, very worrisome for lung cancer.  Plan: Bronchoscopy this week Hold blood thinners (Eliquis) now

## 2014-08-29 NOTE — Addendum Note (Signed)
Addended by: Len Blalock on: 08/29/2014 11:51 AM   Modules accepted: Orders

## 2014-08-30 ENCOUNTER — Telehealth: Payer: Self-pay | Admitting: Pulmonary Disease

## 2014-08-30 ENCOUNTER — Encounter (HOSPITAL_COMMUNITY): Payer: Self-pay

## 2014-08-30 NOTE — Telephone Encounter (Signed)
Patient is having bronch tomorrow and says he has been off Eloquis since Monday.  Has he been off the Eloquis long enough for his procedure?    Left message on voicemail requesting call back from patient.  PW- please advise about Eloquis.

## 2014-08-30 NOTE — Telephone Encounter (Signed)
Would do Ct without contrast IV but can give oral contrast for abd ct

## 2014-08-30 NOTE — Telephone Encounter (Signed)
ATC PT line busy x 3 wcb

## 2014-08-30 NOTE — Telephone Encounter (Signed)
Yes three days off is sufficient

## 2014-08-30 NOTE — Telephone Encounter (Signed)
I called spoke with Stacy from [place]. She reports pt creatinine is 1.94 and has CT chest w/ contrast scheduled for tomorrow. Requesting recs. BQ not on schedule today. Please advise Dr. Joya Gaskins thanks

## 2014-08-30 NOTE — Telephone Encounter (Signed)
Called and made pt aware. Nothing further needed 

## 2014-08-30 NOTE — Telephone Encounter (Signed)
Patient calling to say he is having bronch tomorrow.  He has only been off of Eloquist since Monday and wants to know if that is long enough.  He can be reached at [phone].

## 2014-08-30 NOTE — Telephone Encounter (Signed)
Called made [facility] w/ CT aware. Nothing further needed FYI for Dr. Lake Bells

## 2014-08-31 ENCOUNTER — Ambulatory Visit (HOSPITAL_COMMUNITY)
Admission: RE | Admit: 2014-08-31 | Discharge: 2014-08-31 | Disposition: A | Discharge status: Home / Self Care | Payer: Commercial Managed Care - HMO | Source: Ambulatory Visit | Attending: Pulmonary Disease | Admitting: Pulmonary Disease

## 2014-08-31 ENCOUNTER — Telehealth: Payer: Self-pay | Admitting: Pulmonary Disease

## 2014-08-31 ENCOUNTER — Ambulatory Visit: Admission: RE | Admit: 2014-08-31 | Payer: Self-pay | Source: Ambulatory Visit

## 2014-08-31 ENCOUNTER — Encounter (HOSPITAL_COMMUNITY)
Admission: RE | Disposition: A | Discharge status: Home / Self Care | Payer: Self-pay | Source: Ambulatory Visit | Attending: Pulmonary Disease

## 2014-08-31 DIAGNOSIS — R160 Hepatomegaly, not elsewhere classified: Secondary | ICD-10-CM | POA: Insufficient documentation

## 2014-08-31 DIAGNOSIS — I2510 Atherosclerotic heart disease of native coronary artery without angina pectoris: Secondary | ICD-10-CM | POA: Insufficient documentation

## 2014-08-31 DIAGNOSIS — M899 Disorder of bone, unspecified: Secondary | ICD-10-CM | POA: Insufficient documentation

## 2014-08-31 DIAGNOSIS — I517 Cardiomegaly: Secondary | ICD-10-CM | POA: Diagnosis not present

## 2014-08-31 DIAGNOSIS — R109 Unspecified abdominal pain: Secondary | ICD-10-CM | POA: Insufficient documentation

## 2014-08-31 DIAGNOSIS — Z950 Presence of cardiac pacemaker: Secondary | ICD-10-CM | POA: Diagnosis not present

## 2014-08-31 DIAGNOSIS — R634 Abnormal weight loss: Secondary | ICD-10-CM | POA: Diagnosis not present

## 2014-08-31 DIAGNOSIS — R1900 Intra-abdominal and pelvic swelling, mass and lump, unspecified site: Secondary | ICD-10-CM

## 2014-08-31 DIAGNOSIS — N400 Enlarged prostate without lower urinary tract symptoms: Secondary | ICD-10-CM | POA: Insufficient documentation

## 2014-08-31 DIAGNOSIS — N289 Disorder of kidney and ureter, unspecified: Secondary | ICD-10-CM | POA: Diagnosis not present

## 2014-08-31 DIAGNOSIS — R591 Generalized enlarged lymph nodes: Secondary | ICD-10-CM | POA: Insufficient documentation

## 2014-08-31 DIAGNOSIS — I700 Atherosclerosis of aorta: Secondary | ICD-10-CM | POA: Diagnosis not present

## 2014-08-31 DIAGNOSIS — R918 Other nonspecific abnormal finding of lung field: Secondary | ICD-10-CM | POA: Diagnosis not present

## 2014-08-31 SURGERY — BRONCHOSCOPY, WITH FLUOROSCOPY
Anesthesia: Moderate Sedation | Laterality: Bilateral

## 2014-08-31 NOTE — Telephone Encounter (Signed)
Received call report from [place] at [facility] radiology on pt's CT abdomen and pelvis that was performed on 8.18.2016. Pt seen by BQ on 8.16.16.   Dr. Lake Bells please advise.    IMPRESSION: 1. Persistent and worsening atelectasis/consolidation in the left lower lobe, presumably secondary to either extrinsic or endobronchial obstruction of the left lower lobe bronchus, which likely is secondary to primary bronchogenic neoplasm. This is associated with worsening left hilar and bilateral mediastinal lymphadenopathy, worsening multifocal hepatic lesions which likely represent multifocal hepatic metastases, widespread skeletal metastases, and lymphadenopathy in the abdomen pelvis which is presumably metastatic. 2. Multiple renal lesions incompletely characterized on today's noncontrast CT examination. Statistically, these are likely to represent a combination of cysts and proteinaceous/hemorrhagic cysts. 3. Atherosclerosis, including left main and 3 vessel coronary artery disease. 4. Cardiomegaly with biatrial dilatation. 5. Additional incidental findings, as above.

## 2014-08-31 NOTE — Telephone Encounter (Signed)
I called Bradley Rasmussen to discuss the results from his CT scan today. I explained that it looks like he has a large tumor in his left lung as well as multiple liver lesions representative of metastatic disease. I explained to them what this meant.  Today he came for his bronchoscopy but he was significantly short of breath with minimal exertion. Because of his frail state I do not think that it would be safe to perform a bronchoscopy. I think the best approach is to perform a percutaneous liver biopsy. I explained this to he and his wife in detail. They are okay with this and would prefer to proceed with this plan.   We will place an order for a percutaneous, CT-guided liver biopsy.  I have asked him to continue to hold the Eliquis until he has the biopsy.  Roselie Awkward, MD [facility] Pager: [phone] Cell: [phone] After 3pm or if no response, call [phone]

## 2014-09-04 ENCOUNTER — Other Ambulatory Visit: Payer: Self-pay | Admitting: Radiology

## 2014-09-05 ENCOUNTER — Telehealth: Payer: Self-pay | Admitting: Pulmonary Disease

## 2014-09-05 ENCOUNTER — Ambulatory Visit (HOSPITAL_COMMUNITY): Payer: Self-pay

## 2014-09-05 ENCOUNTER — Ambulatory Visit (HOSPITAL_COMMUNITY)
Admission: RE | Admit: 2014-09-05 | Discharge: 2014-09-05 | Disposition: A | Discharge status: Home / Self Care | Payer: Commercial Managed Care - HMO | Source: Ambulatory Visit | Attending: Pulmonary Disease | Admitting: Pulmonary Disease

## 2014-09-05 ENCOUNTER — Encounter (HOSPITAL_COMMUNITY): Payer: Self-pay

## 2014-09-05 DIAGNOSIS — N289 Disorder of kidney and ureter, unspecified: Secondary | ICD-10-CM | POA: Insufficient documentation

## 2014-09-05 DIAGNOSIS — R160 Hepatomegaly, not elsewhere classified: Secondary | ICD-10-CM | POA: Insufficient documentation

## 2014-09-05 DIAGNOSIS — E119 Type 2 diabetes mellitus without complications: Secondary | ICD-10-CM | POA: Insufficient documentation

## 2014-09-05 DIAGNOSIS — Z7902 Long term (current) use of antithrombotics/antiplatelets: Secondary | ICD-10-CM | POA: Insufficient documentation

## 2014-09-05 DIAGNOSIS — I10 Essential (primary) hypertension: Secondary | ICD-10-CM | POA: Diagnosis not present

## 2014-09-05 DIAGNOSIS — Z87891 Personal history of nicotine dependence: Secondary | ICD-10-CM | POA: Insufficient documentation

## 2014-09-05 DIAGNOSIS — R05 Cough: Secondary | ICD-10-CM | POA: Insufficient documentation

## 2014-09-05 DIAGNOSIS — K769 Liver disease, unspecified: Secondary | ICD-10-CM | POA: Diagnosis present

## 2014-09-05 DIAGNOSIS — C801 Malignant (primary) neoplasm, unspecified: Secondary | ICD-10-CM | POA: Insufficient documentation

## 2014-09-05 DIAGNOSIS — Z950 Presence of cardiac pacemaker: Secondary | ICD-10-CM | POA: Insufficient documentation

## 2014-09-05 DIAGNOSIS — J45909 Unspecified asthma, uncomplicated: Secondary | ICD-10-CM | POA: Diagnosis not present

## 2014-09-05 DIAGNOSIS — E780 Pure hypercholesterolemia: Secondary | ICD-10-CM | POA: Insufficient documentation

## 2014-09-05 DIAGNOSIS — R591 Generalized enlarged lymph nodes: Secondary | ICD-10-CM | POA: Insufficient documentation

## 2014-09-05 DIAGNOSIS — C787 Secondary malignant neoplasm of liver and intrahepatic bile duct: Secondary | ICD-10-CM | POA: Diagnosis not present

## 2014-09-05 DIAGNOSIS — R109 Unspecified abdominal pain: Secondary | ICD-10-CM | POA: Insufficient documentation

## 2014-09-05 DIAGNOSIS — G4730 Sleep apnea, unspecified: Secondary | ICD-10-CM | POA: Diagnosis not present

## 2014-09-05 DIAGNOSIS — D573 Sickle-cell trait: Secondary | ICD-10-CM | POA: Insufficient documentation

## 2014-09-05 DIAGNOSIS — Z79899 Other long term (current) drug therapy: Secondary | ICD-10-CM | POA: Diagnosis not present

## 2014-09-05 DIAGNOSIS — R918 Other nonspecific abnormal finding of lung field: Secondary | ICD-10-CM | POA: Diagnosis not present

## 2014-09-05 LAB — CBC
HCT: 35.1 % — ABNORMAL LOW (ref 39.0–52.0)
HEMOGLOBIN: 11.7 g/dL — AB (ref 13.0–17.0)
MCH: 24 pg — ABNORMAL LOW (ref 26.0–34.0)
MCHC: 33.3 g/dL (ref 30.0–36.0)
MCV: 71.9 fL — ABNORMAL LOW (ref 78.0–100.0)
Platelets: 181 10*3/uL (ref 150–400)
RBC: 4.88 MIL/uL (ref 4.22–5.81)
RDW: 16.7 % — ABNORMAL HIGH (ref 11.5–15.5)
WBC: 13.2 10*3/uL — AB (ref 4.0–10.5)

## 2014-09-05 LAB — APTT: APTT: 28 s (ref 24–37)

## 2014-09-05 LAB — GLUCOSE, CAPILLARY
Glucose-Capillary: 82 mg/dL (ref 65–99)
Glucose-Capillary: 74 mg/dL (ref 65–99)

## 2014-09-05 LAB — PROTIME-INR
INR: 1.16 (ref 0.00–1.49)
PROTHROMBIN TIME: 15 s (ref 11.6–15.2)

## 2014-09-05 MED ORDER — OXYCODONE-ACETAMINOPHEN 5-325 MG PO TABS: 1.0000 | ORAL_TABLET | Freq: Four times a day (QID) | ORAL | Status: DC | PRN

## 2014-09-05 MED ORDER — FENTANYL CITRATE (PF) 100 MCG/2ML IJ SOLN
INTRAMUSCULAR | Status: AC | PRN
  Administered 2014-09-05: 25 ug via INTRAVENOUS

## 2014-09-05 MED ORDER — GELATIN ABSORBABLE 12-7 MM EX MISC
CUTANEOUS | Status: AC
  Filled 2014-09-05: qty 1

## 2014-09-05 MED ORDER — SODIUM CHLORIDE 0.9 % IV SOLN: INTRAVENOUS | Status: DC

## 2014-09-05 MED ORDER — MIDAZOLAM HCL 2 MG/2ML IJ SOLN
INTRAMUSCULAR | Status: AC | PRN
  Administered 2014-09-05: 0.5 mg via INTRAVENOUS

## 2014-09-05 MED ORDER — FENTANYL CITRATE (PF) 100 MCG/2ML IJ SOLN
INTRAMUSCULAR | Status: AC
  Filled 2014-09-05: qty 2

## 2014-09-05 MED ORDER — LIDOCAINE HCL (PF) 1 % IJ SOLN
INTRAMUSCULAR | Status: AC
  Filled 2014-09-05: qty 10

## 2014-09-05 MED ORDER — MIDAZOLAM HCL 2 MG/2ML IJ SOLN
INTRAMUSCULAR | Status: AC
  Filled 2014-09-05: qty 2

## 2014-09-05 NOTE — Telephone Encounter (Signed)
Rx placed at front for patient to pick up\ Will be picked up by Pt's son, Drequan Ironside Nothing further needed.

## 2014-09-05 NOTE — Procedures (Signed)
Successful LT HEPATIC MASS 18 G CORE BXS NO COMP STABLE Path pending Full report in PACS

## 2014-09-05 NOTE — Telephone Encounter (Signed)
Percocet 5/325 mg one po q6h prn pain, dispense #45

## 2014-09-05 NOTE — H&P (Signed)
Chief Complaint: Patient was seen in consultation today for metastatic disease at the request of Perry B  Referring Physician(s): McQuaid,Douglas B  History of Present Illness: Bradley Rasmussen is a 81 y.o. male   Pt with abd pain and cough x 1 month Noted wt loss also Evaluation per MD and referral to Pulmonary; Dr Lake Bells CT 08/31/14: IMPRESSION: 1. Persistent and worsening atelectasis/consolidation in the left lower lobe, presumably secondary to either extrinsic or endobronchial obstruction of the left lower lobe bronchus, which likely is secondary to primary bronchogenic neoplasm. This is associated with worsening left hilar and bilateral mediastinal lymphadenopathy, worsening multifocal hepatic lesions which likely represent multifocal hepatic metastases, widespread skeletal metastases, and lymphadenopathy in the abdomen pelvis which is presumably metastatic. 2. Multiple renal lesions incompletely characterized on today's noncontrast CT examination. Statistically, these are likely to represent a combination of cysts and proteinaceous/hemorrhagic cysts. 3. Atherosclerosis, including left main and 3 vessel coronary artery disease. 4. Cardiomegaly with biatrial dilatation. 5. Additional incidental findings, as above.  Request made for biopsy of liver lesion Approved by Int Radiologist Now scheduled for same  Past Medical History  Diagnosis Date  . High blood pressure   . Irregular heart rhythm   . Asthma   . Diabetes mellitus   . High cholesterol   . Kidney disease   . Sleep apnea   . Sickle cell trait     Past Surgical History  Procedure Laterality Date  . Left lens implant  07/1994  . Left rotator cuff  03/1995  . Pacemaker insertion  03/1997  . Penile prosthesis implant  08/1997  . Right lens implant  06/1999  . Pacemaker replacement  11/2006  . Circumcision  03/2008    Allergies: Amoxicillin  Medications: Prior to Admission medications     Medication Sig Start Date End Date Taking? Authorizing Provider  allopurinol (ZYLOPRIM) 100 MG tablet Take 50 mg by mouth daily.    Yes Historical Provider, MD  apixaban (ELIQUIS) 2.5 MG TABS tablet Take 2.5 mg by mouth 2 (two) times daily.   Yes Historical Provider, MD  carvedilol (COREG) 12.5 MG tablet Take 12.5 mg by mouth 2 (two) times daily with a meal.   Yes Historical Provider, MD  Cholecalciferol (D-3-5) 5000 UNITS capsule Take 5,000 Units by mouth daily.   Yes Historical Provider, MD  docusate sodium (COLACE) 100 MG capsule Take 100 mg by mouth every 6 (six) hours as needed for mild constipation.   Yes Historical Provider, MD  doxazosin (CARDURA) 8 MG tablet Take 8 mg by mouth daily.   Yes Historical Provider, MD  finasteride (PROSCAR) 5 MG tablet Take 5 mg by mouth daily.   Yes Historical Provider, MD  furosemide (LASIX) 40 MG tablet Take 20 mg by mouth 2 (two) times daily.    Yes Historical Provider, MD  glipiZIDE (GLUCOTROL) 5 MG tablet Take by mouth daily before breakfast.   Yes Historical Provider, MD  metolazone (ZAROXOLYN) 5 MG tablet Take 2.5 mg by mouth daily. 07/27/14  Yes Historical Provider, MD  oxyCODONE (OXY IR/ROXICODONE) 5 MG immediate release tablet Take 1 tablet (5 mg total) by mouth every 6 (six) hours as needed (pain). 08/29/14  Yes [name], MD  polyethylene glycol (MIRALAX / GLYCOLAX) packet Take 17 g by mouth daily.   Yes Historical Provider, MD  potassium chloride SA (K-DUR,KLOR-CON) 20 MEQ tablet Take 20 mEq by mouth 3 (three) times daily.    Yes Historical Provider, MD  pravastatin (PRAVACHOL)  80 MG tablet Take 40 mg by mouth every evening.    Yes Historical Provider, MD  Respiratory Therapy Supplies (FLUTTER) DEVI Use as directed 09/12/13  Yes [name], MD     Family History  Problem Relation Age of Onset  . Heart disease Father   . Heart disease Brother     Social History   Social History  . Marital Status: Married    Spouse Name: N/A   . Number of Children: N/A  . Years of Education: N/A   Social History Main Topics  . Smoking status: Former Smoker -- 0.50 packs/day for 40 years    Types: Cigarettes    Quit date: 01/14/1988  . Smokeless tobacco: Never Used  . Alcohol Use: No  . Drug Use: None  . Sexual Activity: Not Asked   Other Topics Concern  . None   Social History Narrative     Review of Systems: A 12 point ROS discussed and pertinent positives are indicated in the HPI above.  All other systems are negative.  Review of Systems  Respiratory: Positive for cough and shortness of breath.   Gastrointestinal: Positive for abdominal pain and abdominal distention.  Neurological: Positive for weakness.  Psychiatric/Behavioral: Negative for behavioral problems and confusion.    Vital Signs: BP 104/68 mmHg  Pulse 74  Temp(Src) 97.9 F (36.6 C) (Oral)  Resp 18  Ht '5\' 9"'$  (1.753 m)  Wt 200 lb (90.719 kg)  BMI 29.52 kg/m2  SpO2 97%  Physical Exam  Constitutional: He is oriented to person, place, and time.  Cardiovascular: Normal rate and regular rhythm.   No murmur heard. Pulmonary/Chest: Effort normal and breath sounds normal. He has no wheezes.  Abdominal: Soft. Bowel sounds are normal. There is tenderness.  Musculoskeletal: Normal range of motion.  Neurological: He is alert and oriented to person, place, and time.  Skin: Skin is warm and dry.  Psychiatric: He has a normal mood and affect. His behavior is normal. Judgment and thought content normal.  Nursing note and vitals reviewed.   Mallampati Score:  MD Evaluation Airway: WNL Heart: WNL Abdomen: WNL Chest/ Lungs: WNL ASA  Classification: 3 Mallampati/Airway Score: Two  Imaging: Ct Abdomen Pelvis Wo Contrast  08/31/2014   CLINICAL DATA:  81 year old male with abdominal pain for the past month. Weight loss for the past several weeks.  EXAM: CT CHEST, ABDOMEN AND PELVIS WITHOUT CONTRAST  TECHNIQUE: Multidetector CT imaging of the chest,  abdomen and pelvis was performed following the standard protocol without IV contrast.  COMPARISON:  Chest CT 07/25/2014.  FINDINGS: CT CHEST FINDINGS  Mediastinum/Lymph Nodes: Heart size is enlarged with biatrial dilatation. There is no significant pericardial fluid, thickening or pericardial calcification. There is atherosclerosis of the thoracic aorta, the great vessels of the mediastinum and the coronary arteries, including calcified atherosclerotic plaque in the left main, left anterior descending, left circumflex and right coronary arteries. Mild calcifications of the aortic valve. Left-sided pacemaker device in position with lead tip terminating in the right ventricular apex. Multiple borderline enlarged and enlarged mediastinal lymph nodes are noted measuring up to 19 mm in short axis in the subcarinal nodal station and 18 mm in short axis in the right paratracheal nodal station. Fullness of soft tissues in the left hilar region suggesting underlying lymphadenopathy, poorly evaluated on today's noncontrast chest CT. Esophagus is unremarkable in appearance. No axillary lymphadenopathy.  Lungs/Pleura: Previously demonstrated atelectasis in the left lower lobe has progressed, now with complete collapse/ consolidation of  the left lower lobe. There is abrupt cut off of the left lower lobe bronchus on image 27 of series 4, likely related to either extrinsic obstruction or endobronchial lesion. No definite pleural effusions. Patchy multifocal peribronchovascular ground-glass attenuation in the right upper and middle lobes is nonspecific, but favored to be infectious or inflammatory in etiology, with the largest ground-glass attenuation area measuring 11 x 8 mm in the lateral segment of the right middle lobe (image 22 of series 4). These are more apparent than the prior study.  Musculoskeletal/Soft Tissues: 1.7 cm lytic lesion in the left side of the manubrium (image 13 of series 4). Expansile lesion with moth-eaten  appearance in the lateral aspect of the left seventh rib, with possible nondisplaced pathologic fracture. Multiple other smaller lucent lesions in the visualized axial and appendicular skeleton, concerning for multifocal skeletal metastasis.  CT ABDOMEN AND PELVIS FINDINGS  Hepatobiliary: The liver is enlarged (22.7 cm craniocaudal span) and very heterogeneous in appearance with what appear to be multiple hepatic nodules and masses (poorly evaluated on today's noncontrast CT examination), highly concerning for widespread hepatic metastases. Gallbladder is unremarkable in appearance.  Pancreas: No definite pancreatic mass or peripancreatic inflammatory changes on today's noncontrast CT examination.  Spleen: Unremarkable.  Adrenals/Urinary Tract: Multiple renal lesions bilaterally are incompletely characterized on today's noncontrast CT examination. These appear similar to the prior study, and the majority of these are low-attenuation, including the largest exophytic lesion in the posterior aspect of the interpolar region of the right kidney which measures 3.7 cm, likely to represent cysts; although several of these lesions are high attenuation, most likely to represent proteinaceous or hemorrhagic cysts. No hydroureteronephrosis in the visualized portions of the abdomen. The unenhanced appearance of the urinary bladder is normal.  Stomach/Bowel: The unenhanced appearance of the stomach is unremarkable. No pathologic dilatation of small bowel or colon. Normal appendix.  Vascular/Lymphatic: Extensive atherosclerosis throughout the abdominal and pelvic vasculature, without evidence of aneurysm. Extensive lymphadenopathy is noted throughout the visualized abdomen pelvis, including portacaval lymph node measuring up to 3.1 cm in short axis, numerous retroperitoneal lymph nodes measuring up to 1.4 cm in short axis, and pelvic lymph nodes measuring up to 1.2 cm in short axis in the right common iliac nodal position.   Reproductive: Prostate gland is enlarged with median lobe hypertrophy. Seminal vesicles are unremarkable. Penile prosthesis with reservoir in the lower right anterior abdominal wall.  Other: No significant volume of ascites.  No pneumoperitoneum.  Musculoskeletal: Numerous ill-defined lucent lesions in the visualized axial skeleton, concerning for multifocal metastases, most notably in the left side of the L3 vertebral body where there is a 12 mm lucent lesion (image 75 of series 2). 2.2 x 4.2 cm high attenuation (57 HU) lesion in the subcutaneous fat overlying the right buttocks (image 91 of series 2) may represent a hematoma, or potential soft tissue metastasis.  IMPRESSION: 1. Persistent and worsening atelectasis/consolidation in the left lower lobe, presumably secondary to either extrinsic or endobronchial obstruction of the left lower lobe bronchus, which likely is secondary to primary bronchogenic neoplasm. This is associated with worsening left hilar and bilateral mediastinal lymphadenopathy, worsening multifocal hepatic lesions which likely represent multifocal hepatic metastases, widespread skeletal metastases, and lymphadenopathy in the abdomen pelvis which is presumably metastatic. 2. Multiple renal lesions incompletely characterized on today's noncontrast CT examination. Statistically, these are likely to represent a combination of cysts and proteinaceous/hemorrhagic cysts. 3. Atherosclerosis, including left main and 3 vessel coronary artery disease. 4. Cardiomegaly with biatrial  dilatation. 5. Additional incidental findings, as above. These results will be called to the ordering clinician or representative by the Radiologist Assistant, and communication documented in the PACS or zVision Dashboard.   Electronically Signed   By: Vinnie Langton M.D.   On: 08/31/2014 13:49   Ct Chest Wo Contrast  08/31/2014   CLINICAL DATA:  81 year old male with abdominal pain for the past month. Weight loss for the  past several weeks.  EXAM: CT CHEST, ABDOMEN AND PELVIS WITHOUT CONTRAST  TECHNIQUE: Multidetector CT imaging of the chest, abdomen and pelvis was performed following the standard protocol without IV contrast.  COMPARISON:  Chest CT 07/25/2014.  FINDINGS: CT CHEST FINDINGS  Mediastinum/Lymph Nodes: Heart size is enlarged with biatrial dilatation. There is no significant pericardial fluid, thickening or pericardial calcification. There is atherosclerosis of the thoracic aorta, the great vessels of the mediastinum and the coronary arteries, including calcified atherosclerotic plaque in the left main, left anterior descending, left circumflex and right coronary arteries. Mild calcifications of the aortic valve. Left-sided pacemaker device in position with lead tip terminating in the right ventricular apex. Multiple borderline enlarged and enlarged mediastinal lymph nodes are noted measuring up to 19 mm in short axis in the subcarinal nodal station and 18 mm in short axis in the right paratracheal nodal station. Fullness of soft tissues in the left hilar region suggesting underlying lymphadenopathy, poorly evaluated on today's noncontrast chest CT. Esophagus is unremarkable in appearance. No axillary lymphadenopathy.  Lungs/Pleura: Previously demonstrated atelectasis in the left lower lobe has progressed, now with complete collapse/ consolidation of the left lower lobe. There is abrupt cut off of the left lower lobe bronchus on image 27 of series 4, likely related to either extrinsic obstruction or endobronchial lesion. No definite pleural effusions. Patchy multifocal peribronchovascular ground-glass attenuation in the right upper and middle lobes is nonspecific, but favored to be infectious or inflammatory in etiology, with the largest ground-glass attenuation area measuring 11 x 8 mm in the lateral segment of the right middle lobe (image 22 of series 4). These are more apparent than the prior study.   Musculoskeletal/Soft Tissues: 1.7 cm lytic lesion in the left side of the manubrium (image 13 of series 4). Expansile lesion with moth-eaten appearance in the lateral aspect of the left seventh rib, with possible nondisplaced pathologic fracture. Multiple other smaller lucent lesions in the visualized axial and appendicular skeleton, concerning for multifocal skeletal metastasis.  CT ABDOMEN AND PELVIS FINDINGS  Hepatobiliary: The liver is enlarged (22.7 cm craniocaudal span) and very heterogeneous in appearance with what appear to be multiple hepatic nodules and masses (poorly evaluated on today's noncontrast CT examination), highly concerning for widespread hepatic metastases. Gallbladder is unremarkable in appearance.  Pancreas: No definite pancreatic mass or peripancreatic inflammatory changes on today's noncontrast CT examination.  Spleen: Unremarkable.  Adrenals/Urinary Tract: Multiple renal lesions bilaterally are incompletely characterized on today's noncontrast CT examination. These appear similar to the prior study, and the majority of these are low-attenuation, including the largest exophytic lesion in the posterior aspect of the interpolar region of the right kidney which measures 3.7 cm, likely to represent cysts; although several of these lesions are high attenuation, most likely to represent proteinaceous or hemorrhagic cysts. No hydroureteronephrosis in the visualized portions of the abdomen. The unenhanced appearance of the urinary bladder is normal.  Stomach/Bowel: The unenhanced appearance of the stomach is unremarkable. No pathologic dilatation of small bowel or colon. Normal appendix.  Vascular/Lymphatic: Extensive atherosclerosis throughout the abdominal and  pelvic vasculature, without evidence of aneurysm. Extensive lymphadenopathy is noted throughout the visualized abdomen pelvis, including portacaval lymph node measuring up to 3.1 cm in short axis, numerous retroperitoneal lymph nodes  measuring up to 1.4 cm in short axis, and pelvic lymph nodes measuring up to 1.2 cm in short axis in the right common iliac nodal position.  Reproductive: Prostate gland is enlarged with median lobe hypertrophy. Seminal vesicles are unremarkable. Penile prosthesis with reservoir in the lower right anterior abdominal wall.  Other: No significant volume of ascites.  No pneumoperitoneum.  Musculoskeletal: Numerous ill-defined lucent lesions in the visualized axial skeleton, concerning for multifocal metastases, most notably in the left side of the L3 vertebral body where there is a 12 mm lucent lesion (image 75 of series 2). 2.2 x 4.2 cm high attenuation (57 HU) lesion in the subcutaneous fat overlying the right buttocks (image 91 of series 2) may represent a hematoma, or potential soft tissue metastasis.  IMPRESSION: 1. Persistent and worsening atelectasis/consolidation in the left lower lobe, presumably secondary to either extrinsic or endobronchial obstruction of the left lower lobe bronchus, which likely is secondary to primary bronchogenic neoplasm. This is associated with worsening left hilar and bilateral mediastinal lymphadenopathy, worsening multifocal hepatic lesions which likely represent multifocal hepatic metastases, widespread skeletal metastases, and lymphadenopathy in the abdomen pelvis which is presumably metastatic. 2. Multiple renal lesions incompletely characterized on today's noncontrast CT examination. Statistically, these are likely to represent a combination of cysts and proteinaceous/hemorrhagic cysts. 3. Atherosclerosis, including left main and 3 vessel coronary artery disease. 4. Cardiomegaly with biatrial dilatation. 5. Additional incidental findings, as above. These results will be called to the ordering clinician or representative by the Radiologist Assistant, and communication documented in the PACS or zVision Dashboard.   Electronically Signed   By: Vinnie Langton M.D.   On: 08/31/2014  13:49    Labs:  CBC:  Recent Labs  08/29/14 1200  WBC 14.9*  HGB 12.6*  HCT 39.3  PLT 220.0    COAGS: No results for input(s): INR, APTT in the last 8760 hours.  BMP:  Recent Labs  08/29/14 1200  NA 139  K 4.6  CL 95*  CO2 28  GLUCOSE 74  BUN 60*  CALCIUM 10.5  CREATININE 1.94*    LIVER FUNCTION TESTS: No results for input(s): BILITOT, AST, ALT, ALKPHOS, PROT, ALBUMIN in the last 8760 hours.  TUMOR MARKERS: No results for input(s): AFPTM, CEA, CA199, CHROMGRNA in the last 8760 hours.  Assessment and Plan:  Wt loss abd pain; cough Work up reveals L lung mass; LAN Hepatic lesions Now scheduled for liver lesion bx Risks and Benefits discussed with the patient including, but not limited to bleeding, infection, damage to adjacent structures or low yield requiring additional tests. All of the patient's questions were answered, patient is agreeable to proceed. Consent signed and in chart.   Thank you for this interesting consult.  I greatly enjoyed meeting Bradley Rasmussen and look forward to participating in their care.  A copy of this report was sent to the requesting provider on this date.  Signed: Kierre Deines A 09/05/2014, 9:19 AM   I spent a total of  30 Minutes   in face to face in clinical consultation, greater than 50% of which was counseling/coordinating care for liver lesion bx

## 2014-09-05 NOTE — Telephone Encounter (Signed)
Patient says his stomach pain is increasing.  Patient is requesting stronger pain medication.  Patient is currently at [facility] having a liver biopsy.  Would like to come by the office to pick up a prescription today after her gets out of the hospital.  Dr. Lake Bells, please advise.

## 2014-09-05 NOTE — Discharge Instructions (Signed)

## 2014-09-08 ENCOUNTER — Telehealth: Payer: Self-pay | Admitting: Pulmonary Disease

## 2014-09-08 ENCOUNTER — Inpatient Hospital Stay (HOSPITAL_COMMUNITY): Payer: Commercial Managed Care - HMO

## 2014-09-08 ENCOUNTER — Inpatient Hospital Stay (HOSPITAL_COMMUNITY)
Admission: EM | Admit: 2014-09-08 | Discharge: 2014-09-18 | DRG: 180 | Disposition: A | Discharge status: Home Health | Payer: Commercial Managed Care - HMO | Attending: Internal Medicine | Admitting: Internal Medicine

## 2014-09-08 ENCOUNTER — Encounter (HOSPITAL_COMMUNITY): Payer: Self-pay | Admitting: Emergency Medicine

## 2014-09-08 ENCOUNTER — Emergency Department (HOSPITAL_COMMUNITY): Payer: Commercial Managed Care - HMO

## 2014-09-08 DIAGNOSIS — R590 Localized enlarged lymph nodes: Secondary | ICD-10-CM | POA: Diagnosis present

## 2014-09-08 DIAGNOSIS — E1122 Type 2 diabetes mellitus with diabetic chronic kidney disease: Secondary | ICD-10-CM | POA: Diagnosis present

## 2014-09-08 DIAGNOSIS — C3402 Malignant neoplasm of left main bronchus: Secondary | ICD-10-CM

## 2014-09-08 DIAGNOSIS — R0602 Shortness of breath: Secondary | ICD-10-CM | POA: Diagnosis present

## 2014-09-08 DIAGNOSIS — Z79899 Other long term (current) drug therapy: Secondary | ICD-10-CM | POA: Diagnosis not present

## 2014-09-08 DIAGNOSIS — N170 Acute kidney failure with tubular necrosis: Secondary | ICD-10-CM | POA: Diagnosis not present

## 2014-09-08 DIAGNOSIS — I5022 Chronic systolic (congestive) heart failure: Secondary | ICD-10-CM | POA: Diagnosis not present

## 2014-09-08 DIAGNOSIS — M7989 Other specified soft tissue disorders: Secondary | ICD-10-CM | POA: Diagnosis present

## 2014-09-08 DIAGNOSIS — R160 Hepatomegaly, not elsewhere classified: Secondary | ICD-10-CM | POA: Diagnosis present

## 2014-09-08 DIAGNOSIS — H532 Diplopia: Secondary | ICD-10-CM

## 2014-09-08 DIAGNOSIS — M5387 Other specified dorsopathies, lumbosacral region: Secondary | ICD-10-CM | POA: Diagnosis present

## 2014-09-08 DIAGNOSIS — J45909 Unspecified asthma, uncomplicated: Secondary | ICD-10-CM | POA: Diagnosis present

## 2014-09-08 DIAGNOSIS — A419 Sepsis, unspecified organism: Secondary | ICD-10-CM | POA: Diagnosis not present

## 2014-09-08 DIAGNOSIS — C7951 Secondary malignant neoplasm of bone: Secondary | ICD-10-CM | POA: Diagnosis present

## 2014-09-08 DIAGNOSIS — R634 Abnormal weight loss: Secondary | ICD-10-CM | POA: Diagnosis present

## 2014-09-08 DIAGNOSIS — E875 Hyperkalemia: Secondary | ICD-10-CM | POA: Diagnosis not present

## 2014-09-08 DIAGNOSIS — Z950 Presence of cardiac pacemaker: Secondary | ICD-10-CM | POA: Diagnosis not present

## 2014-09-08 DIAGNOSIS — I442 Atrioventricular block, complete: Secondary | ICD-10-CM | POA: Diagnosis present

## 2014-09-08 DIAGNOSIS — Z79891 Long term (current) use of opiate analgesic: Secondary | ICD-10-CM | POA: Diagnosis not present

## 2014-09-08 DIAGNOSIS — J449 Chronic obstructive pulmonary disease, unspecified: Secondary | ICD-10-CM | POA: Diagnosis present

## 2014-09-08 DIAGNOSIS — E780 Pure hypercholesterolemia: Secondary | ICD-10-CM | POA: Diagnosis present

## 2014-09-08 DIAGNOSIS — Z7901 Long term (current) use of anticoagulants: Secondary | ICD-10-CM | POA: Diagnosis not present

## 2014-09-08 DIAGNOSIS — Z8249 Family history of ischemic heart disease and other diseases of the circulatory system: Secondary | ICD-10-CM | POA: Diagnosis not present

## 2014-09-08 DIAGNOSIS — J189 Pneumonia, unspecified organism: Secondary | ICD-10-CM | POA: Diagnosis present

## 2014-09-08 DIAGNOSIS — C801 Malignant (primary) neoplasm, unspecified: Secondary | ICD-10-CM

## 2014-09-08 DIAGNOSIS — N179 Acute kidney failure, unspecified: Secondary | ICD-10-CM | POA: Diagnosis not present

## 2014-09-08 DIAGNOSIS — J9811 Atelectasis: Secondary | ICD-10-CM | POA: Diagnosis present

## 2014-09-08 DIAGNOSIS — Z515 Encounter for palliative care: Secondary | ICD-10-CM | POA: Diagnosis not present

## 2014-09-08 DIAGNOSIS — R1011 Right upper quadrant pain: Secondary | ICD-10-CM | POA: Diagnosis present

## 2014-09-08 DIAGNOSIS — R1310 Dysphagia, unspecified: Secondary | ICD-10-CM | POA: Diagnosis present

## 2014-09-08 DIAGNOSIS — I509 Heart failure, unspecified: Secondary | ICD-10-CM | POA: Diagnosis not present

## 2014-09-08 DIAGNOSIS — J9601 Acute respiratory failure with hypoxia: Secondary | ICD-10-CM | POA: Diagnosis present

## 2014-09-08 DIAGNOSIS — Z880 Allergy status to penicillin: Secondary | ICD-10-CM

## 2014-09-08 DIAGNOSIS — I129 Hypertensive chronic kidney disease with stage 1 through stage 4 chronic kidney disease, or unspecified chronic kidney disease: Secondary | ICD-10-CM | POA: Diagnosis present

## 2014-09-08 DIAGNOSIS — C3432 Malignant neoplasm of lower lobe, left bronchus or lung: Secondary | ICD-10-CM | POA: Diagnosis present

## 2014-09-08 DIAGNOSIS — C7800 Secondary malignant neoplasm of unspecified lung: Secondary | ICD-10-CM | POA: Diagnosis present

## 2014-09-08 DIAGNOSIS — Z66 Do not resuscitate: Secondary | ICD-10-CM | POA: Diagnosis not present

## 2014-09-08 DIAGNOSIS — N183 Chronic kidney disease, stage 3 (moderate): Secondary | ICD-10-CM | POA: Diagnosis present

## 2014-09-08 DIAGNOSIS — C787 Secondary malignant neoplasm of liver and intrahepatic bile duct: Secondary | ICD-10-CM | POA: Diagnosis present

## 2014-09-08 DIAGNOSIS — R918 Other nonspecific abnormal finding of lung field: Secondary | ICD-10-CM | POA: Diagnosis not present

## 2014-09-08 DIAGNOSIS — Z87891 Personal history of nicotine dependence: Secondary | ICD-10-CM

## 2014-09-08 DIAGNOSIS — G4733 Obstructive sleep apnea (adult) (pediatric): Secondary | ICD-10-CM | POA: Diagnosis present

## 2014-09-08 DIAGNOSIS — Z23 Encounter for immunization: Secondary | ICD-10-CM

## 2014-09-08 DIAGNOSIS — R609 Edema, unspecified: Secondary | ICD-10-CM

## 2014-09-08 DIAGNOSIS — E860 Dehydration: Secondary | ICD-10-CM | POA: Diagnosis not present

## 2014-09-08 DIAGNOSIS — R109 Unspecified abdominal pain: Secondary | ICD-10-CM | POA: Diagnosis present

## 2014-09-08 DIAGNOSIS — E871 Hypo-osmolality and hyponatremia: Secondary | ICD-10-CM | POA: Diagnosis present

## 2014-09-08 DIAGNOSIS — E872 Acidosis, unspecified: Secondary | ICD-10-CM | POA: Diagnosis present

## 2014-09-08 DIAGNOSIS — N281 Cyst of kidney, acquired: Secondary | ICD-10-CM | POA: Diagnosis present

## 2014-09-08 DIAGNOSIS — C799 Secondary malignant neoplasm of unspecified site: Secondary | ICD-10-CM | POA: Diagnosis not present

## 2014-09-08 DIAGNOSIS — I4891 Unspecified atrial fibrillation: Secondary | ICD-10-CM | POA: Diagnosis present

## 2014-09-08 DIAGNOSIS — E11649 Type 2 diabetes mellitus with hypoglycemia without coma: Secondary | ICD-10-CM | POA: Diagnosis present

## 2014-09-08 DIAGNOSIS — R030 Elevated blood-pressure reading, without diagnosis of hypertension: Secondary | ICD-10-CM | POA: Diagnosis present

## 2014-09-08 DIAGNOSIS — R0600 Dyspnea, unspecified: Secondary | ICD-10-CM | POA: Insufficient documentation

## 2014-09-08 DIAGNOSIS — E876 Hypokalemia: Secondary | ICD-10-CM | POA: Diagnosis present

## 2014-09-08 DIAGNOSIS — Z9989 Dependence on other enabling machines and devices: Secondary | ICD-10-CM

## 2014-09-08 DIAGNOSIS — N178 Other acute kidney failure: Secondary | ICD-10-CM | POA: Diagnosis not present

## 2014-09-08 DIAGNOSIS — Z7189 Other specified counseling: Secondary | ICD-10-CM | POA: Diagnosis not present

## 2014-09-08 DIAGNOSIS — K7290 Hepatic failure, unspecified without coma: Secondary | ICD-10-CM | POA: Diagnosis present

## 2014-09-08 DIAGNOSIS — C3492 Malignant neoplasm of unspecified part of left bronchus or lung: Secondary | ICD-10-CM | POA: Diagnosis not present

## 2014-09-08 DIAGNOSIS — C3490 Malignant neoplasm of unspecified part of unspecified bronchus or lung: Secondary | ICD-10-CM | POA: Insufficient documentation

## 2014-09-08 DIAGNOSIS — E440 Moderate protein-calorie malnutrition: Secondary | ICD-10-CM | POA: Diagnosis not present

## 2014-09-08 DIAGNOSIS — D573 Sickle-cell trait: Secondary | ICD-10-CM | POA: Diagnosis present

## 2014-09-08 HISTORY — DX: Unspecified atrial fibrillation: I48.91

## 2014-09-08 HISTORY — DX: Presence of cardiac pacemaker: Z95.0

## 2014-09-08 HISTORY — DX: Atrioventricular block, complete: I44.2

## 2014-09-08 LAB — CBC WITH DIFFERENTIAL/PLATELET
BASOS ABS: 0 10*3/uL (ref 0.0–0.1)
BASOS PCT: 0 % (ref 0–1)
Eosinophils Absolute: 0.2 10*3/uL (ref 0.0–0.7)
Eosinophils Relative: 1 % (ref 0–5)
HEMATOCRIT: 36.4 % — AB (ref 39.0–52.0)
HEMOGLOBIN: 11.8 g/dL — AB (ref 13.0–17.0)
LYMPHS PCT: 5 % — AB (ref 12–46)
Lymphs Abs: 0.6 10*3/uL — ABNORMAL LOW (ref 0.7–4.0)
MCH: 23.8 pg — ABNORMAL LOW (ref 26.0–34.0)
MCHC: 32.4 g/dL (ref 30.0–36.0)
MCV: 73.4 fL — AB (ref 78.0–100.0)
MONO ABS: 1.2 10*3/uL — AB (ref 0.1–1.0)
Monocytes Relative: 9 % (ref 3–12)
NEUTROS ABS: 12 10*3/uL — AB (ref 1.7–7.7)
NEUTROS PCT: 85 % — AB (ref 43–77)
Platelets: 184 10*3/uL (ref 150–400)
RBC: 4.96 MIL/uL (ref 4.22–5.81)
RDW: 17.2 % — ABNORMAL HIGH (ref 11.5–15.5)
WBC: 14 10*3/uL — ABNORMAL HIGH (ref 4.0–10.5)

## 2014-09-08 LAB — URINALYSIS, ROUTINE W REFLEX MICROSCOPIC
BILIRUBIN URINE: NEGATIVE
Glucose, UA: NEGATIVE mg/dL
Hgb urine dipstick: NEGATIVE
KETONES UR: NEGATIVE mg/dL
LEUKOCYTES UA: NEGATIVE
NITRITE: NEGATIVE
PH: 5.5 (ref 5.0–8.0)
PROTEIN: NEGATIVE mg/dL
Specific Gravity, Urine: 1.014 (ref 1.005–1.030)
UROBILINOGEN UA: 0.2 mg/dL (ref 0.0–1.0)

## 2014-09-08 LAB — HEPATIC FUNCTION PANEL
ALBUMIN: 2.5 g/dL — AB (ref 3.5–5.0)
ALT: 47 U/L (ref 17–63)
AST: 115 U/L — AB (ref 15–41)
Alkaline Phosphatase: 490 U/L — ABNORMAL HIGH (ref 38–126)
BILIRUBIN TOTAL: 1 mg/dL (ref 0.3–1.2)
Bilirubin, Direct: 0.7 mg/dL — ABNORMAL HIGH (ref 0.1–0.5)
Indirect Bilirubin: 0.3 mg/dL (ref 0.3–0.9)
TOTAL PROTEIN: 6.7 g/dL (ref 6.5–8.1)

## 2014-09-08 LAB — BASIC METABOLIC PANEL
ANION GAP: 23 — AB (ref 5–15)
BUN: 79 mg/dL — ABNORMAL HIGH (ref 6–20)
CALCIUM: 10.5 mg/dL — AB (ref 8.9–10.3)
CO2: 19 mmol/L — AB (ref 22–32)
Chloride: 93 mmol/L — ABNORMAL LOW (ref 101–111)
Creatinine, Ser: 2.14 mg/dL — ABNORMAL HIGH (ref 0.61–1.24)
GFR, EST AFRICAN AMERICAN: 32 mL/min — AB (ref >60)
GFR, EST NON AFRICAN AMERICAN: 27 mL/min — AB (ref >60)
GLUCOSE: 61 mg/dL — AB (ref 65–99)
POTASSIUM: 5.3 mmol/L — AB (ref 3.5–5.1)
Sodium: 135 mmol/L (ref 135–145)

## 2014-09-08 LAB — POTASSIUM: POTASSIUM: 5.4 mmol/L — AB (ref 3.5–5.1)

## 2014-09-08 LAB — I-STAT CG4 LACTIC ACID, ED: LACTIC ACID, VENOUS: 13.44 mmol/L — AB (ref 0.5–2.0)

## 2014-09-08 LAB — TROPONIN I: Troponin I: 0.03 ng/mL (ref <0.031)

## 2014-09-08 LAB — LACTIC ACID, PLASMA: Lactic Acid, Venous: 10.7 mmol/L (ref 0.5–2.0)

## 2014-09-08 LAB — CK: CK TOTAL: 24 U/L — AB (ref 49–397)

## 2014-09-08 LAB — MRSA PCR SCREENING: MRSA BY PCR: NEGATIVE

## 2014-09-08 LAB — PROTIME-INR
INR: 1.18 (ref 0.00–1.49)
Prothrombin Time: 15.2 seconds (ref 11.6–15.2)

## 2014-09-08 LAB — PROCALCITONIN: Procalcitonin: 1.7 ng/mL

## 2014-09-08 MED ORDER — OXYCODONE HCL 5 MG PO TABS
5.0000 mg | ORAL_TABLET | Freq: Four times a day (QID) | ORAL | Status: DC | PRN
  Administered 2014-09-08 – 2014-09-15 (×5): 5 mg via ORAL
  Filled 2014-09-08 (×5): qty 1

## 2014-09-08 MED ORDER — SODIUM CHLORIDE 0.9 % IV SOLN: INTRAVENOUS | Status: DC

## 2014-09-08 MED ORDER — DEXTROSE 5 % IV SOLN
1.0000 g | Freq: Three times a day (TID) | INTRAVENOUS | Status: DC
  Administered 2014-09-09: 1 g via INTRAVENOUS
  Filled 2014-09-08: qty 1

## 2014-09-08 MED ORDER — VANCOMYCIN HCL IN DEXTROSE 1-5 GM/200ML-% IV SOLN
1000.0000 mg | INTRAVENOUS | Status: DC
  Administered 2014-09-09: 1000 mg via INTRAVENOUS
  Filled 2014-09-08: qty 200

## 2014-09-08 MED ORDER — DOCUSATE SODIUM 100 MG PO CAPS: 100.0000 mg | ORAL_CAPSULE | Freq: Four times a day (QID) | ORAL | Status: DC | PRN

## 2014-09-08 MED ORDER — POLYETHYLENE GLYCOL 3350 17 G PO PACK
17.0000 g | PACK | Freq: Every day | ORAL | Status: DC
  Administered 2014-09-09 – 2014-09-18 (×10): 17 g via ORAL
  Filled 2014-09-08 (×11): qty 1

## 2014-09-08 MED ORDER — SODIUM CHLORIDE 0.9 % IV SOLN
INTRAVENOUS | Status: DC
  Administered 2014-09-08: 21:00:00 via INTRAVENOUS

## 2014-09-08 MED ORDER — IPRATROPIUM BROMIDE 0.02 % IN SOLN
0.5000 mg | Freq: Four times a day (QID) | RESPIRATORY_TRACT | Status: DC
  Administered 2014-09-08: 0.5 mg via RESPIRATORY_TRACT
  Filled 2014-09-08: qty 2.5

## 2014-09-08 MED ORDER — DEXTROSE 5 % IV SOLN
2.0000 g | Freq: Three times a day (TID) | INTRAVENOUS | Status: DC
  Filled 2014-09-08 (×2): qty 2

## 2014-09-08 MED ORDER — ALBUTEROL SULFATE (2.5 MG/3ML) 0.083% IN NEBU
5.0000 mg | INHALATION_SOLUTION | RESPIRATORY_TRACT | Status: DC | PRN
  Administered 2014-09-08: 5 mg via RESPIRATORY_TRACT
  Filled 2014-09-08: qty 6

## 2014-09-08 MED ORDER — INSULIN ASPART 100 UNIT/ML ~~LOC~~ SOLN
0.0000 [IU] | SUBCUTANEOUS | Status: DC
  Administered 2014-09-09: 1 [IU] via SUBCUTANEOUS
  Administered 2014-09-09: 2 [IU] via SUBCUTANEOUS
  Administered 2014-09-09 – 2014-09-10 (×3): 1 [IU] via SUBCUTANEOUS

## 2014-09-08 MED ORDER — IPRATROPIUM BROMIDE 0.02 % IN SOLN
0.5000 mg | Freq: Once | RESPIRATORY_TRACT | Status: AC
  Administered 2014-09-08: 0.5 mg via RESPIRATORY_TRACT
  Filled 2014-09-08: qty 2.5

## 2014-09-08 MED ORDER — ALBUTEROL SULFATE (2.5 MG/3ML) 0.083% IN NEBU
2.5000 mg | INHALATION_SOLUTION | RESPIRATORY_TRACT | Status: DC | PRN
  Administered 2014-09-11 – 2014-09-14 (×2): 2.5 mg via RESPIRATORY_TRACT
  Filled 2014-09-08 (×3): qty 3

## 2014-09-08 MED ORDER — IPRATROPIUM-ALBUTEROL 0.5-2.5 (3) MG/3ML IN SOLN
3.0000 mL | Freq: Three times a day (TID) | RESPIRATORY_TRACT | Status: DC
  Administered 2014-09-09 – 2014-09-11 (×7): 3 mL via RESPIRATORY_TRACT
  Filled 2014-09-08 (×7): qty 3

## 2014-09-08 MED ORDER — SODIUM CHLORIDE 0.9 % IV BOLUS (SEPSIS)
500.0000 mL | Freq: Once | INTRAVENOUS | Status: AC
  Administered 2014-09-08: 500 mL via INTRAVENOUS

## 2014-09-08 MED ORDER — CARVEDILOL 12.5 MG PO TABS
12.5000 mg | ORAL_TABLET | Freq: Two times a day (BID) | ORAL | Status: DC
  Administered 2014-09-09 – 2014-09-18 (×19): 12.5 mg via ORAL
  Filled 2014-09-08 (×20): qty 1

## 2014-09-08 MED ORDER — ALBUTEROL SULFATE (2.5 MG/3ML) 0.083% IN NEBU
5.0000 mg | INHALATION_SOLUTION | Freq: Once | RESPIRATORY_TRACT | Status: AC
  Administered 2014-09-08: 5 mg via RESPIRATORY_TRACT
  Filled 2014-09-08: qty 6

## 2014-09-08 MED ORDER — VANCOMYCIN HCL 10 G IV SOLR
1500.0000 mg | INTRAVENOUS | Status: AC
  Administered 2014-09-08: 1500 mg via INTRAVENOUS
  Filled 2014-09-08: qty 1500

## 2014-09-08 MED ORDER — GUAIFENESIN ER 600 MG PO TB12
600.0000 mg | ORAL_TABLET | Freq: Two times a day (BID) | ORAL | Status: DC
  Administered 2014-09-08 – 2014-09-18 (×20): 600 mg via ORAL
  Filled 2014-09-08 (×20): qty 1

## 2014-09-08 MED ORDER — AZTREONAM 2 G IJ SOLR
2.0000 g | INTRAMUSCULAR | Status: AC
  Administered 2014-09-08: 2 g via INTRAVENOUS
  Filled 2014-09-08: qty 2

## 2014-09-08 MED ORDER — APIXABAN 5 MG PO TABS
2.5000 mg | ORAL_TABLET | Freq: Two times a day (BID) | ORAL | Status: DC
  Administered 2014-09-08: 2.5 mg via ORAL
  Filled 2014-09-08: qty 1

## 2014-09-08 MED ORDER — ALBUTEROL SULFATE (2.5 MG/3ML) 0.083% IN NEBU
INHALATION_SOLUTION | RESPIRATORY_TRACT | Status: AC
  Filled 2014-09-08: qty 6

## 2014-09-08 MED ORDER — DEXTROSE 5 % IV SOLN
1.0000 g | Freq: Three times a day (TID) | INTRAVENOUS | Status: DC
  Filled 2014-09-08: qty 1

## 2014-09-08 MED ORDER — PREDNISONE 20 MG PO TABS
60.0000 mg | ORAL_TABLET | Freq: Once | ORAL | Status: AC
  Administered 2014-09-08: 60 mg via ORAL
  Filled 2014-09-08: qty 3

## 2014-09-08 NOTE — Progress Notes (Signed)
Patient denies the consistent use of nocturnal CPAP at home. He complains of the CPAP making his mucous too hard to expectorate and feels as if he is smothering in the mask. He does not wish to use nocturnal CPAP tonight. RT will continue to follow.

## 2014-09-08 NOTE — ED Notes (Addendum)
Pt states that hasnt been told what kind of cancer he has yet.  Pt c/o SHOB x month.   Pt referred to come here by PCP.  Expand All Collapse All   I called Mr. Craze to let him know that his biopsy results showed cancer. The pathologist wasn't sure exactly where it is coming from.   I worry he has lung cancer, so path is holding tissue for further lung specific testing.  Will place thoracic surgery consult.  When I called him his wife said that he has been much more short of breath in the last few days, very weak, and has been swelling. When the patient came to the phone he could barely talk to me because of shortness of breath.  I recommended that he come to the [facility] ED for evaluation and admission. Discussed with EDP.  Roselie Awkward, MD [facility]

## 2014-09-08 NOTE — ED Notes (Signed)
RT called for nebulizer tx

## 2014-09-08 NOTE — H&P (Signed)
PCP: Marco Collie, MD  Pulmonology [facility] Cardiology Caryl Comes  Referring provider Eulis Foster   Chief Complaint:  Shortness of breath, RUQ pain  HPI: Bradley Rasmussen is a 81 y.o. male   has a past medical history of High blood pressure; Irregular heart rhythm; Asthma; Diabetes mellitus; High cholesterol; Kidney disease; Sleep apnea; and Sickle cell trait.   2 Weeks ago patient was diagnosed with lumbar mass with metastasis to the liver. Patient has 40 pack years smoking history. Patient has been followed by pulmonology. Has not established care with oncology yet.   On 23 of August patient undergone liver lesion biopsy results showing poorly differentiated malignant lesion.  Presented with  progressively worsening shortness of breath and generalized weakness. On the phone with pulmonology was having hard time catching his breath.  Patient was instructed to come to emerge department today. Patient reports gradual worsening in shortness of breath. He has been having some abdominal pain and reports sensation of food getting stuck. The sensation is both with liquids and solids. This has been getting worse over past few days. He has been eating and drinking less due to this pain. Denies pain in the area of Biopsy. Denies chest pain, no pain with respiration. NO fever he has had persistent cough.  Family endorsing stuggering gait. Patient reports diplopia  On arrival to emergency department patient was noted to have worsening renal function, potassium elevated 5.2 white blood cell count elevated at 14 and lactic acid of 13.4 case was discussed by ER physician with [facility] who recommended admission to stepdown and fluid resuscitation.    Patient has long-standing history of asthma. Diet controlled diabetes and chronic kidney disease Baseline creatinine 1.6 Hospitalist was called for admission for dehydration Lactic acidosis, dehydration and acute on chronic renal failure  Review of Systems:    Pertinent  positives include: indigestion, abdominal pain,  double vision, productive cough,  gait abnormality,  Constitutional:  No weight loss, night sweats, Fevers, chills,  HEENT:  No headaches, Difficulty swallowing,Tooth/dental problems,Sore throat,  No sneezing, itching, ear ache, nasal congestion, post nasal drip,  Cardio-vascular:  No chest pain, Orthopnea, PND, anasarca, dizziness, palpitations.no Bilateral lower extremity swelling  GI:  No heartburn, nausea, vomiting, diarrhea, change in bowel habits, loss of appetite, melena, blood in stool, hematemesis Resp:  no shortness of breath at rest. No dyspnea on exertion, No excess mucus, no  No non-productive cough, No coughing up of blood.No change in color of mucus.No wheezing. Skin:  no rash or lesions. No jaundice GU:  no dysuria, change in color of urine, no urgency or frequency. No straining to urinate.  No flank pain.  Musculoskeletal:  No joint pain or no joint swelling. No decreased range of motion. No back pain.  Psych:  No change in mood or affect. No depression or anxiety. No memory loss.  Neuro: no localizing neurological complaints, no tingling, no weakness,  no no slurred speech, no confusion  Otherwise ROS are negative except for above, 10 systems were reviewed  Past Medical History: Past Medical History  Diagnosis Date  . High blood pressure   . Irregular heart rhythm   . Asthma   . Diabetes mellitus   . High cholesterol   . Kidney disease   . Sleep apnea   . Sickle cell trait    Past Surgical History  Procedure Laterality Date  . Left lens implant  07/1994  . Left rotator cuff  03/1995  . Pacemaker insertion  03/1997  . Penile  prosthesis implant  08/1997  . Right lens implant  06/1999  . Pacemaker replacement  11/2006  . Circumcision  03/2008     Medications: Prior to Admission medications   Medication Sig Start Date End Date Taking? Authorizing Provider  allopurinol (ZYLOPRIM) 100 MG tablet Take 50 mg by  mouth daily.    Yes Historical Provider, MD  apixaban (ELIQUIS) 2.5 MG TABS tablet Take 2.5 mg by mouth 2 (two) times daily.   Yes Historical Provider, MD  carvedilol (COREG) 12.5 MG tablet Take 12.5 mg by mouth 2 (two) times daily with a meal.   Yes Historical Provider, MD  Cholecalciferol (D-3-5) 5000 UNITS capsule Take 5,000 Units by mouth daily.   Yes Historical Provider, MD  docusate sodium (COLACE) 100 MG capsule Take 100 mg by mouth every 6 (six) hours as needed for mild constipation.   Yes Historical Provider, MD  doxazosin (CARDURA) 8 MG tablet Take 8 mg by mouth daily.   Yes Historical Provider, MD  finasteride (PROSCAR) 5 MG tablet Take 5 mg by mouth daily.   Yes Historical Provider, MD  furosemide (LASIX) 40 MG tablet Take 20 mg by mouth 2 (two) times daily.    Yes Historical Provider, MD  glipiZIDE (GLUCOTROL) 5 MG tablet Take by mouth daily before breakfast.   Yes Historical Provider, MD  metolazone (ZAROXOLYN) 5 MG tablet Take 2.5 mg by mouth daily. 07/27/14  Yes Historical Provider, MD  oxyCODONE-acetaminophen (PERCOCET/ROXICET) 5-325 MG per tablet Take 1 tablet by mouth every 6 (six) hours as needed for severe pain. 09/05/14  Yes [name], MD  polyethylene glycol (MIRALAX / GLYCOLAX) packet Take 17 g by mouth daily.   Yes Historical Provider, MD  potassium chloride SA (K-DUR,KLOR-CON) 20 MEQ tablet Take 20 mEq by mouth 3 (three) times daily.    Yes Historical Provider, MD  pravastatin (PRAVACHOL) 80 MG tablet Take 40 mg by mouth every evening.    Yes Historical Provider, MD  Respiratory Therapy Supplies (FLUTTER) DEVI Use as directed 09/12/13  Yes [name], MD  oxyCODONE (OXY IR/ROXICODONE) 5 MG immediate release tablet Take 1 tablet (5 mg total) by mouth every 6 (six) hours as needed (pain). Patient not taking: Reported on 09/08/2014 08/29/14   [name], MD    Allergies:   Allergies  Allergen Reactions  . Amoxicillin Itching and Rash    Social  History:  Ambulatory   independently   Lives at home  With family     reports that he quit smoking about 26 years ago. His smoking use included Cigarettes. He has a 20 pack-year smoking history. He has never used smokeless tobacco. He reports that he does not drink alcohol or use illicit drugs.    Family History: family history includes Heart disease in his brother and father.    Physical Exam: Patient Vitals for the past 24 hrs:  BP Temp Temp src Pulse Resp SpO2  09/08/14 1745 114/68 mmHg - - 76 18 95 %  09/08/14 1738 - 98.9 F (37.2 C) Rectal - - -  09/08/14 1638 - - - - - 98 %  09/08/14 1520 113/68 mmHg 97.7 F (36.5 C) Oral 76 19 97 %    1. General:  in No Acute distress 2. Psychological: Alert and   Oriented 3. Head/ENT:     Dry Mucous Membranes  Head Non traumatic, neck supple                          Normal   Dentition 4. SKIN:  decreased Skin turgor,  Skin clean Dry and intact no rash 5. Heart: Regular rate and rhythm no Murmur, Rub or gallop 6. Lungs:   no wheezes or crackles  slightly diminished 7. Abdomen: Soft, non-tender, slightly distended 8. Lower extremities: no clubbing, cyanosis, 1+ edema 9. Neurologically cranial nerves II through XII intact patient has chronic left facial droop which family states unchanged from prior. Moving all 4 extremities equally 10. MSK: Normal range of motion  body mass index is unknown because there is no weight on file.   Labs on Admission:   Results for orders placed or performed during the hospital encounter of 09/08/14 (from the past 24 hour(s))  Basic metabolic panel     Status: Abnormal   Collection Time: 09/08/14  4:04 PM  Result Value Ref Range   Sodium 135 135 - 145 mmol/L   Potassium 5.3 (H) 3.5 - 5.1 mmol/L   Chloride 93 (L) 101 - 111 mmol/L   CO2 19 (L) 22 - 32 mmol/L   Glucose, Bld 61 (L) 65 - 99 mg/dL   BUN 79 (H) 6 - 20 mg/dL   Creatinine, Ser 2.14 (H) 0.61 - 1.24 mg/dL   Calcium  10.5 (H) 8.9 - 10.3 mg/dL   GFR calc non Af Amer 27 (L) >60 mL/min   GFR calc Af Amer 32 (L) >60 mL/min   Anion gap 23 (H) 5 - 15  CBC with Differential     Status: Abnormal   Collection Time: 09/08/14  4:04 PM  Result Value Ref Range   WBC 14.0 (H) 4.0 - 10.5 K/uL   RBC 4.96 4.22 - 5.81 MIL/uL   Hemoglobin 11.8 (L) 13.0 - 17.0 g/dL   HCT 36.4 (L) 39.0 - 52.0 %   MCV 73.4 (L) 78.0 - 100.0 fL   MCH 23.8 (L) 26.0 - 34.0 pg   MCHC 32.4 30.0 - 36.0 g/dL   RDW 17.2 (H) 11.5 - 15.5 %   Platelets 184 150 - 400 K/uL   Neutrophils Relative % 85 (H) 43 - 77 %   Neutro Abs 12.0 (H) 1.7 - 7.7 K/uL   Lymphocytes Relative 5 (L) 12 - 46 %   Lymphs Abs 0.6 (L) 0.7 - 4.0 K/uL   Monocytes Relative 9 3 - 12 %   Monocytes Absolute 1.2 (H) 0.1 - 1.0 K/uL   Eosinophils Relative 1 0 - 5 %   Eosinophils Absolute 0.2 0.0 - 0.7 K/uL   Basophils Relative 0 0 - 1 %   Basophils Absolute 0.0 0.0 - 0.1 K/uL  Troponin I     Status: None   Collection Time: 09/08/14  4:04 PM  Result Value Ref Range   Troponin I 0.03 <0.031 ng/mL  I-Stat CG4 Lactic Acid, ED     Status: Abnormal   Collection Time: 09/08/14  4:08 PM  Result Value Ref Range   Lactic Acid, Venous 13.44 (HH) 0.5 - 2.0 mmol/L   Comment NOTIFIED PHYSICIAN   Urinalysis, Routine w reflex microscopic (not at [facility])     Status: Abnormal   Collection Time: 09/08/14  5:39 PM  Result Value Ref Range   Color, Urine YELLOW YELLOW   APPearance CLOUDY (A) CLEAR   Specific Gravity, Urine 1.014 1.005 - 1.030   pH 5.5  5.0 - 8.0   Glucose, UA NEGATIVE NEGATIVE mg/dL   Hgb urine dipstick NEGATIVE NEGATIVE   Bilirubin Urine NEGATIVE NEGATIVE   Ketones, ur NEGATIVE NEGATIVE mg/dL   Protein, ur NEGATIVE NEGATIVE mg/dL   Urobilinogen, UA 0.2 0.0 - 1.0 mg/dL   Nitrite NEGATIVE NEGATIVE   Leukocytes, UA NEGATIVE NEGATIVE    UA no evidence of UTI No results found for: HGBA1C  Estimated Creatinine Clearance: 30.6 mL/min (by C-G formula based on Cr of  2.14).  BNP (last 3 results) No results for input(s): PROBNP in the last 8760 hours.  Other results:  I have pearsonaly reviewed this: ECG REPORT  Rate: 75  Rhythm: appears to be paced ST&T Change: N/A   There were no vitals filed for this visit.   Cultures: No results found for: SDES, Riva, CULT, REPTSTATUS   Radiological Exams on Admission: Dg Chest 2 View  09/08/2014   CLINICAL DATA:  Shortness of breath for about a month. Abdominal pain. Diagnosed with a "Lung mass about a week ago" .  EXAM: CHEST  2 VIEW  COMPARISON:  Plain films 08/22/2014.  CT of 08/31/2014  FINDINGS: Mild right hemidiaphragm elevation. Single lead pacer tip at right ventricle. Patient rotated minimally left. Midline trachea. Mild cardiomegaly. Atherosclerosis in the transverse aorta. No pleural effusion or pneumothorax. Dense left lower lobe opacity again identified, similar. Cutoff of the left lower lobe bronchus.  IMPRESSION: No change since 08/22/2014.  Left lower lobe opacity, better evaluated on 08/31/2014 CT.  Cardiomegaly and atherosclerosis.   Electronically Signed   By: Abigail Miyamoto M.D.   On: 09/08/2014 17:20    Chart has been reviewed  Family   at  Bedside  plan of care was discussed with   Wife Vane Yapp [phone]  Assessment/Plan 81 year old gentleman with history of January day diagnosed lung cancer with metastasis to liver nodes and bone presents with poor by mouth intake due to dysphagia and evidence of dehydration with acute on chronic renal failure  Was found to have leukocytosis and evidence of elevated lactic acid up to 14. Differential including possible sepsis due to postobstructive pneumonia secondary to lung mass. Versus liver malfunction in the setting of significant metastatic disease.   Present on Admission:  . Dehydration - we will rehydrate hold Lasix and metolazone for now monitor blood pressures  . Lung mass - patient has been followed by pulmonology. Given liver  biopsy worrisome for metastatic poorly differentiated carcinoma will need oncology input. Will call oncology consult in the morning  . OSA on CPAP - will order  . Shortness of breath - multifactorial patient has lung mass as well as asthma component, given high risk factors will obtain VQ scan. He should not take candidate for CT angiogram. Patient has known systolic heart failure will check echo gram. Provide nebulizers as needed and scheduled Atrovent. Currently patient does not appear to be wheezing will hold off on steroids . Marland Kitchen Acute renal failure acute on chronic Will give gentle IV fluids hold Lasix and metolazone obtain urine electrolytes  . Liver mass -significant metastatic spread. We'll need to discuss with oncology with his potential options are  . Hyperkalemia. Potassium recheck electrolytes after fluid administration. Monitor on telemetry  . hypoglycemia  - patient has had poor by mouth intake will stop glipizide. Order for hypoglycemia protocol   . Lactic acidosis recheck lactic acid likely secondary to poorly but clearance although sepsis could not be ruled out  . Sepsis questionable sepsis will give  IV fluids start on antibiotics obtain blood cultures  . Systolic CHF, chronic currently appears to be intravascularly depleted we will hold Lasix and metolazone for now obtain echogram. Given worsening dyspnea cycle cardiac enzymes History of diabetes  - will hold glipizide order sliding scale monitor for hypoglycemia History of atrial fibrillation status post pacemaker for complete heart block. Currently completely paced patient on Eliquis for now will continue Dysphagia worrisome for mass causing some pressure on esophagus. This needs to be further evaluated once diet is restarted after VQ scan has been completed can obtain swallowing study to evaluate this further  Prophylaxis: Eliquis   CODE STATUS:  FULL CODE  as per patient    Disposition: To home once workup is complete and  patient is stable  Other plan as per orders.  I have spent a total of 75 min on this admission extra time was taken to discuss case with PCCM and Dr. Burr Medico with oncology regarding the patient.   Bertram 09/08/2014, 6:37 PM  [facility]  Pager [phone]   after 2 AM please page floor coverage PA If 7AM-7PM, please contact the day team taking care of the patient  [email or web address]  Password [email or web address]

## 2014-09-08 NOTE — Telephone Encounter (Signed)
I called Mr. Berthold to let him know that his biopsy results showed cancer.  The pathologist wasn't sure exactly where it is coming from.    I worry he has lung cancer, so path is holding tissue for further lung specific testing.  Will place thoracic surgery consult.  When I called him his wife said that he has been much more short of breath in the last few days, very weak, and has been swelling.  When the patient came to the phone he could barely talk to me because of shortness of breath.  I recommended that he come to the [facility] ED for evaluation and admission.  Discussed with EDP.  Roselie Awkward, MD [place] [facility] Pager: [phone] Cell: [phone] After 3pm or if no response, call [phone]

## 2014-09-08 NOTE — Consult Note (Signed)
[place]  [phone] Fax:(336) [phone]  Clinic Inpatient New Consult Note   Patient Care Team: Marco Collie, MD as PCP - General (Family Medicine) 09/08/2014  Referring physician:  Dr. Roel Cluck  CHIEF COMPLAINTS/PURPOSE OF CONSULTATION:  Newly diagnosed metastatic poorly differentiated carcinoma, likely lung primary   HISTORY OF PRESENTING ILLNESS:  Bradley Rasmussen 81 y.o. male with past medical history of hypertension, diabetes, chronic in disease, asthma and her smoking who was recently found a lung mass and liver metastasis. He is being admitted to the [facility] for dehydration. I was called to the patient for his newly diagnosed metastatic carcinoma.   The patient and his family, he has been feeling quite fatigued, intermittent productive cough, was named dyspnea on exertion, anorexia, 20-25 pound weight loss in the past few months. He was able to function well at home in the past, but he is barely able to walk for 10 feet without stopping lately. He underwent imaging studies, which showed a left lower lobe consolidation, left hilar and bilateral mediastinal lymphadenopathy, and a multifocal liver lesions, widespread skeletal metastasis and lymphadenopathy. He underwent liver biopsy on 09/05/2014, which showed poorly differentiated carcinoma. He was admitted to the hospital for worsening dyspnea and a hydration.  MEDICAL HISTORY:  Past Medical History  Diagnosis Date  . High blood pressure   . Irregular heart rhythm   . Asthma   . Diabetes mellitus   . High cholesterol   . Kidney disease   . Sleep apnea   . Sickle cell trait   . A-fib   . Complete heart block   . Pacemaker     SURGICAL HISTORY: Past Surgical History  Procedure Laterality Date  . Left lens implant  07/1994  . Left rotator cuff  03/1995  . Pacemaker insertion  03/1997  . Penile prosthesis implant  08/1997  . Right lens implant  06/1999  . Pacemaker replacement  11/2006  .  Circumcision  03/2008    SOCIAL HISTORY: Social History   Social History  . Marital Status: Married    Spouse Name: N/A  . Number of Children: N/A  . Years of Education: N/A   Occupational History  . Not on file.   Social History Main Topics  . Smoking status: Former Smoker -- 0.50 packs/day for 40 years    Types: Cigarettes    Quit date: 01/14/1988  . Smokeless tobacco: Never Used  . Alcohol Use: No  . Drug Use: No  . Sexual Activity: Not on file   Other Topics Concern  . Not on file   Social History Narrative    FAMILY HISTORY: Family History  Problem Relation Age of Onset  . Heart disease Father   . Heart disease Brother     ALLERGIES:  is allergic to amoxicillin.  MEDICATIONS:  Current Facility-Administered Medications  Medication Dose Route Frequency Provider Last Rate Last Dose  . 0.9 %  sodium chloride infusion   Intravenous Continuous Daleen Bo, MD      . aztreonam (AZACTAM) 2 g in dextrose 5 % 50 mL IVPB  2 g Intravenous Q8H Toy Baker, MD        REVIEW OF SYSTEMS:   Constitutional: Denies fevers, chills or abnormal night sweats, (+) fatigue and weight loss  Eyes: Denies blurriness of vision, double vision or watery eyes Ears, nose, mouth, throat, and face: Denies mucositis or sore throat Respiratory: see HPI  Cardiovascular: Denies palpitation, chest discomfort or lower extremity swelling Gastrointestinal:  Denies nausea, heartburn or change in bowel habits Skin: Denies abnormal skin rashes Lymphatics: Denies new lymphadenopathy or easy bruising Neurological:Denies numbness, tingling or new weaknesses Behavioral/Psych: Mood is stable, no new changes  All other systems were reviewed with the patient and are negative.  PHYSICAL EXAMINATION: ECOG PERFORMANCE STATUS: 3 - Symptomatic, >50% confined to bed  Filed Vitals:   09/08/14 1900  BP: 135/79  Pulse: 85  Temp:   Resp: 21   Connected to the good" GENERAL:alert, no distress and  comfortable SKIN: skin color, texture, turgor are normal, no rashes or significant lesions EYES: normal, conjunctiva are pink and non-injected, sclera clear OROPHARYNX:no exudate, no erythema and lips, buccal mucosa, and tongue normal  NECK: supple, thyroid normal size, non-tender, without nodularity LYMPH:  no palpable lymphadenopathy in the cervical, axillary or inguinal LUNGS: clear to auscultation and percussion with normal breathing effort HEART: regular rate & rhythm and no murmurs and no lower extremity edema ABDOMEN:abdomen soft, non-tender and normal bowel sounds Musculoskeletal:no cyanosis of digits and no clubbing  PSYCH: alert & oriented x 3 with fluent speech NEURO: no focal motor/sensory deficits  LABORATORY DATA:  I have reviewed the data as listed Lab Results  Component Value Date   WBC 14.0* 09/08/2014   HGB 11.8* 09/08/2014   HCT 36.4* 09/08/2014   MCV 73.4* 09/08/2014   PLT 184 09/08/2014    Recent Labs  08/29/14 1200 09/08/14 1604 09/08/14 1806  NA 139 135  --   K 4.6 5.3* 5.4*  CL 95* 93*  --   CO2 28 19*  --   GLUCOSE 74 61*  --   BUN 60* 79*  --   CREATININE 1.94* 2.14*  --   CALCIUM 10.5 10.5*  --   GFRNONAA  --  27*  --   GFRAA  --  32*  --   PROT  --  6.7  --   ALBUMIN  --  2.5*  --   AST  --  115*  --   ALT  --  47  --   ALKPHOS  --  490*  --   BILITOT  --  1.0  --   BILIDIR  --  0.7*  --   IBILI  --  0.3  --    PATHOLOGY REPORT: Diagnosis 09/05/2014 Liver, needle/core biopsy, left mass - METASTATIC POORLY DIFFERENTIATED CARCINOMA, SEE COMMENT. Microscopic Comment Liver is extensively involved by poorly differentiated carcinoma with the following immunophenotype: Cytokeratin AE1/AE3 - strong diffuse expression. CD56 - strong diffuse expression. Chromogranin - negative expression. TTF-1 - negative expression. CDX-2 - negative expression. PSA - negative expression. Prostein - negative expression. Cytokeratin 5/6 - patchy moderate  strong expression. Hepar - diffuse moderate expression. Napsin - negative expression. p63 - negative expression. Polyclonal CEA - diffuse moderate strong expression (cytoplasmic and membranous) The clinical history of a lung mass is noted. Definitive immunophenotypic features of squamous or glandular differentiation are not present. Although CD56 is expressed within the tumor, additional neuroendocrine markers are negative. Overall, the morphologic and immunophenotypic features are non-specific and best characterized as poorly differentiated carcinoma, not otherwise specified. The case is reviewed with Dr. Saralyn Pilar who concurs. The case is discussed with Dr. Lake Bells on 09/08/14 (CRR:gt, 09/08/14)  RADIOGRAPHIC STUDIES: I have personally reviewed the radiological images as listed and agreed with the findings in the report.  Ct CHEST, Abdomen Pelvis Wo Contrast 08/31/2014   FINDINGS: CT CHEST FINDINGS  Mediastinum/Lymph Nodes: Heart size is enlarged with biatrial dilatation. There  is no significant pericardial fluid, thickening or pericardial calcification. There is atherosclerosis of the thoracic aorta, the great vessels of the mediastinum and the coronary arteries, including calcified atherosclerotic plaque in the left main, left anterior descending, left circumflex and right coronary arteries. Mild calcifications of the aortic valve. Left-sided pacemaker device in position with lead tip terminating in the right ventricular apex. Multiple borderline enlarged and enlarged mediastinal lymph nodes are noted measuring up to 19 mm in short axis in the subcarinal nodal station and 18 mm in short axis in the right paratracheal nodal station. Fullness of soft tissues in the left hilar region suggesting underlying lymphadenopathy, poorly evaluated on today's noncontrast chest CT. Esophagus is unremarkable in appearance. No axillary lymphadenopathy.  Lungs/Pleura: Previously demonstrated atelectasis in the left  lower lobe has progressed, now with complete collapse/ consolidation of the left lower lobe. There is abrupt cut off of the left lower lobe bronchus on image 27 of series 4, likely related to either extrinsic obstruction or endobronchial lesion. No definite pleural effusions. Patchy multifocal peribronchovascular ground-glass attenuation in the right upper and middle lobes is nonspecific, but favored to be infectious or inflammatory in etiology, with the largest ground-glass attenuation area measuring 11 x 8 mm in the lateral segment of the right middle lobe (image 22 of series 4). These are more apparent than the prior study.  Musculoskeletal/Soft Tissues: 1.7 cm lytic lesion in the left side of the manubrium (image 13 of series 4). Expansile lesion with moth-eaten appearance in the lateral aspect of the left seventh rib, with possible nondisplaced pathologic fracture. Multiple other smaller lucent lesions in the visualized axial and appendicular skeleton, concerning for multifocal skeletal metastasis.  CT ABDOMEN AND PELVIS FINDINGS  Hepatobiliary: The liver is enlarged (22.7 cm craniocaudal span) and very heterogeneous in appearance with what appear to be multiple hepatic nodules and masses (poorly evaluated on today's noncontrast CT examination), highly concerning for widespread hepatic metastases. Gallbladder is unremarkable in appearance.  Pancreas: No definite pancreatic mass or peripancreatic inflammatory changes on today's noncontrast CT examination.  Spleen: Unremarkable.  Adrenals/Urinary Tract: Multiple renal lesions bilaterally are incompletely characterized on today's noncontrast CT examination. These appear similar to the prior study, and the majority of these are low-attenuation, including the largest exophytic lesion in the posterior aspect of the interpolar region of the right kidney which measures 3.7 cm, likely to represent cysts; although several of these lesions are high attenuation, most  likely to represent proteinaceous or hemorrhagic cysts. No hydroureteronephrosis in the visualized portions of the abdomen. The unenhanced appearance of the urinary bladder is normal.  Stomach/Bowel: The unenhanced appearance of the stomach is unremarkable. No pathologic dilatation of small bowel or colon. Normal appendix.  Vascular/Lymphatic: Extensive atherosclerosis throughout the abdominal and pelvic vasculature, without evidence of aneurysm. Extensive lymphadenopathy is noted throughout the visualized abdomen pelvis, including portacaval lymph node measuring up to 3.1 cm in short axis, numerous retroperitoneal lymph nodes measuring up to 1.4 cm in short axis, and pelvic lymph nodes measuring up to 1.2 cm in short axis in the right common iliac nodal position.  Reproductive: Prostate gland is enlarged with median lobe hypertrophy. Seminal vesicles are unremarkable. Penile prosthesis with reservoir in the lower right anterior abdominal wall.  Other: No significant volume of ascites.  No pneumoperitoneum.  Musculoskeletal: Numerous ill-defined lucent lesions in the visualized axial skeleton, concerning for multifocal metastases, most notably in the left side of the L3 vertebral body where there is a 12 mm lucent lesion (  image 75 of series 2). 2.2 x 4.2 cm high attenuation (57 HU) lesion in the subcutaneous fat overlying the right buttocks (image 91 of series 2) may represent a hematoma, or potential soft tissue metastasis.  IMPRESSION: 1. Persistent and worsening atelectasis/consolidation in the left lower lobe, presumably secondary to either extrinsic or endobronchial obstruction of the left lower lobe bronchus, which likely is secondary to primary bronchogenic neoplasm. This is associated with worsening left hilar and bilateral mediastinal lymphadenopathy, worsening multifocal hepatic lesions which likely represent multifocal hepatic metastases, widespread skeletal metastases, and lymphadenopathy in the abdomen  pelvis which is presumably metastatic. 2. Multiple renal lesions incompletely characterized on today's noncontrast CT examination. Statistically, these are likely to represent a combination of cysts and proteinaceous/hemorrhagic cysts. 3. Atherosclerosis, including left main and 3 vessel coronary artery disease. 4. Cardiomegaly with biatrial dilatation. 5. Additional incidental findings, as above. These results will be called to the ordering clinician or representative by the Radiologist Assistant, and communication documented in the PACS or zVision Dashboard.   Electronically Signed   By: Vinnie Langton M.D.   On: 08/31/2014 13:49    ASSESSMENT & PLAN:  81 years old African-American male, with past medical history of hypertension, diabetes, H fibrillation, status post pacemaker placement, presented with worsening dyspnea on exertion, mild intermittent left chest pain, anorexia, weight loss and weakness.  1. Metastatic lung cancer -His liver biopsy showed metastatic carcinoma, CT scan showed left lower lobe consolidation, likely secondary to bronchial obstruction, significant left hilar and mediastinal adenopathy, bone metastasis, this is most likely consistent with metastatic lung cancer. -His CT head without contrast was negative. Not able to do MRI due to the pacemaker -We discussed that his cancer is incurable at this stage, and overall prognosis is very poor giving the widely metastatic disease, and overall the aggressive nature of lung cancer.  -I recommend palliative radiation to the left hilar lung lesion, to improve his dyspnea, I have personally spoke with radiation oncologist Dr. Sondra Come who will see him soon -We discussed the systemic treatment options, including chemotherapy, targeted therapy if his tumor has actionable mutations, and immunotherapy. Due to his advanced age, and a poor performance status, he is not a candidate for cytotoxic chemotherapy. -I'll request Foundation one test to  see if his tumor has actionable mutation -I'll see him in my clinic in 2-3 weeks to discuss treatment options based on his Foundation one test results -alternatively, supportive care alone and hospice is also reasonable, given his overall poor prognosis and limited treatment options. However patient is more interested in anticancer treatment, and I'll reevaluate him to see if his performance status improved after radiation  2. Anorexia and weight loss -I encouraged him to try nutrition supplement, such as Ensure or boost -I will consider a short course of dexamethasone, such as dexa 4-'8mg'$  daily, and mirtazapine for his anorexia and weakness  3. AKI -IVF and supportive care   Recommendations -I have call rad/onc Dr. Sondra Come to see pt, and possible starting radiation early next week  -I will set up his follow up with me in 2 weeks after his discharge  -I will request FoundationOne on Monday    All questions were answered. The patient knows to call the clinic with any problems, questions or concerns. I spent 50 minutes counseling the patient and his family face to face. The total time spent in the appointment was 60 minutes and more than 50% was on counseling.     Truitt Merle, MD 09/08/2014  8:55 PM

## 2014-09-08 NOTE — Telephone Encounter (Signed)
Patient calling to get biopsy results. Dr. Lake Bells, please advise.

## 2014-09-08 NOTE — ED Notes (Signed)
Patient transported to X-ray 

## 2014-09-08 NOTE — ED Notes (Signed)
Dr Eulis Foster notified of patients lactic acid result.

## 2014-09-08 NOTE — ED Notes (Signed)
Sandwich given to patient.

## 2014-09-08 NOTE — Progress Notes (Signed)
ANTIBIOTIC CONSULT NOTE - INITIAL  Pharmacy Consult for Vancomycin Indication: pneumonia, sepsis  Allergies  Allergen Reactions  . Amoxicillin Itching and Rash    Patient Measurements:     Vital Signs: Temp: 98.9 F (37.2 C) (08/26 1738) Temp Source: Rectal (08/26 1738) BP: 135/79 mmHg (08/26 1900) Pulse Rate: 85 (08/26 1900) Intake/Output from previous day:   Intake/Output from this shift:    Labs:  Recent Labs  09/08/14 1604  WBC 14.0*  HGB 11.8*  PLT 184  CREATININE 2.14*   Estimated Creatinine Clearance: 30.6 mL/min (by C-G formula based on Cr of 2.14). No results for input(s): VANCOTROUGH, VANCOPEAK, VANCORANDOM, GENTTROUGH, GENTPEAK, GENTRANDOM, TOBRATROUGH, TOBRAPEAK, TOBRARND, AMIKACINPEAK, AMIKACINTROU, AMIKACIN in the last 72 hours.   Microbiology: No results found for this or any previous visit (from the past 720 hour(s)).  Medical History: Past Medical History  Diagnosis Date  . High blood pressure   . Irregular heart rhythm   . Asthma   . Diabetes mellitus   . High cholesterol   . Kidney disease   . Sleep apnea   . Sickle cell trait   . A-fib   . Complete heart block   . Pacemaker     Assessment: 29 yoM admitted with sepsis 2/2 pneumonia.  Patient was recently diagnosed with lung mass with metastasis to the liver.  Blood and urine cultures collected.  Aztreonam and Vancomycin initiated.  WBC 14, LA 13.44 AKI - SCr 2.14, CrCl~34 ml/min (CG), ~27 ml/min (normalized)  Goal of Therapy:  Vancomycin trough level 15-20 mcg/ml  Doses adjusted per renal function Eradication of infection  Plan:  1.  Vancomycin 1500 mg IV x 1 then 1g IV q24h. 2.  Aztreonam 2g x 1 as ordered then start 1g IV q8h.   3.  F/u SCr, trough levels, culture results, clinical course.  Bradley Rasmussen 09/08/2014,8:57 PM

## 2014-09-08 NOTE — Telephone Encounter (Signed)
Called and spoke to pt's EC, [name]. [name] states she was informed by BQ that the pt will be admitted but pt is currently in ED. [name] was under the impression that the pt was a direct admit. [name], per BQ's note, the pt is to go to the ED for eval and admission and the EDP is aware of the situation. [name] verbalized understanding and denied any further questions or concerns at this time.     [name], MD at 09/08/2014 1:04 PM     Status: Signed       Expand All Collapse All   I called Bradley Rasmussen to let him know that his biopsy results showed cancer. The pathologist wasn't sure exactly where it is coming from.   I worry he has lung cancer, so path is holding tissue for further lung specific testing.  Will place thoracic surgery consult.  When I called him his wife said that he has been much more short of breath in the last few days, very weak, and has been swelling. When the patient came to the phone he could barely talk to me because of shortness of breath.  I recommended that he come to the [facility] ED for evaluation and admission. Discussed with EDP.  Roselie Awkward, MD [facility] Pager: [phone] Cell: [phone] After 3pm or if no response, call [phone]

## 2014-09-08 NOTE — ED Provider Notes (Signed)
CSN: [id number]     Arrival date & time 09/08/14  1507 History   First MD Initiated Contact with Patient 09/08/14 1527     Chief Complaint  Patient presents with  . Shortness of Breath     (Consider location/radiation/quality/duration/timing/severity/associated sxs/prior Treatment) HPI   Bradley Rasmussen is a 81 y.o. male who is here for evaluation of gradual onset of weakness and shortness of breath over the last several days. And liver biopsy 8 days ago to evaluate for possible metastatic lung cancer. The biopsies reported to be positive for cancer, type unknown at this time. He has chronic respiratory disease, treated with nebulizer. He did not use his nebulizer today. His pulmonologist called him today to give him the biopsy results, and when he talked to the patient on the phone he was concerned that the patient was significantly short of breath, so he recommended that he come to the ED for evaluation. The patient has cough but no sputum production unless he coughs "a lot". The sputum produced is yellowish-green in color. He has had chills but no documented fever. He had decreased appetite today, but was eating well, yesterday. There are no known sick contacts. There are no other known modifying factors.   Past Medical History  Diagnosis Date  . High blood pressure   . Irregular heart rhythm   . Asthma   . Diabetes mellitus   . High cholesterol   . Kidney disease   . Sleep apnea   . Sickle cell trait    Past Surgical History  Procedure Laterality Date  . Left lens implant  07/1994  . Left rotator cuff  03/1995  . Pacemaker insertion  03/1997  . Penile prosthesis implant  08/1997  . Right lens implant  06/1999  . Pacemaker replacement  11/2006  . Circumcision  03/2008   Family History  Problem Relation Age of Onset  . Heart disease Father   . Heart disease Brother    Social History  Substance Use Topics  . Smoking status: Former Smoker -- 0.50 packs/day for 40 years    Types:  Cigarettes    Quit date: 01/14/1988  . Smokeless tobacco: Never Used  . Alcohol Use: No    Review of Systems  All other systems reviewed and are negative.     Allergies  Amoxicillin  Home Medications   Prior to Admission medications   Medication Sig Start Date End Date Taking? Authorizing Provider  allopurinol (ZYLOPRIM) 100 MG tablet Take 50 mg by mouth daily.    Yes Historical Provider, MD  apixaban (ELIQUIS) 2.5 MG TABS tablet Take 2.5 mg by mouth 2 (two) times daily.   Yes Historical Provider, MD  carvedilol (COREG) 12.5 MG tablet Take 12.5 mg by mouth 2 (two) times daily with a meal.   Yes Historical Provider, MD  Cholecalciferol (D-3-5) 5000 UNITS capsule Take 5,000 Units by mouth daily.   Yes Historical Provider, MD  docusate sodium (COLACE) 100 MG capsule Take 100 mg by mouth every 6 (six) hours as needed for mild constipation.   Yes Historical Provider, MD  doxazosin (CARDURA) 8 MG tablet Take 8 mg by mouth daily.   Yes Historical Provider, MD  finasteride (PROSCAR) 5 MG tablet Take 5 mg by mouth daily.   Yes Historical Provider, MD  furosemide (LASIX) 40 MG tablet Take 20 mg by mouth 2 (two) times daily.    Yes Historical Provider, MD  glipiZIDE (GLUCOTROL) 5 MG tablet Take by mouth daily before  breakfast.   Yes Historical Provider, MD  metolazone (ZAROXOLYN) 5 MG tablet Take 2.5 mg by mouth daily. 07/27/14  Yes Historical Provider, MD  oxyCODONE-acetaminophen (PERCOCET/ROXICET) 5-325 MG per tablet Take 1 tablet by mouth every 6 (six) hours as needed for severe pain. 09/05/14  Yes [name], MD  polyethylene glycol (MIRALAX / GLYCOLAX) packet Take 17 g by mouth daily.   Yes Historical Provider, MD  potassium chloride SA (K-DUR,KLOR-CON) 20 MEQ tablet Take 20 mEq by mouth 3 (three) times daily.    Yes Historical Provider, MD  pravastatin (PRAVACHOL) 80 MG tablet Take 40 mg by mouth every evening.    Yes Historical Provider, MD  Respiratory Therapy Supplies (FLUTTER)  DEVI Use as directed 09/12/13  Yes [name], MD  oxyCODONE (OXY IR/ROXICODONE) 5 MG immediate release tablet Take 1 tablet (5 mg total) by mouth every 6 (six) hours as needed (pain). Patient not taking: Reported on 09/08/2014 08/29/14   [name], MD   BP 113/68 mmHg  Pulse 76  Temp(Src) 98.9 F (37.2 C) (Rectal)  Resp 19  SpO2 98% Physical Exam  Constitutional: He is oriented to person, place, and time. He appears well-developed. No distress.  Elderly, frail  HENT:  Head: Normocephalic and atraumatic.  Right Ear: External ear normal.  Left Ear: External ear normal.  Eyes: Conjunctivae and EOM are normal. Pupils are equal, round, and reactive to light.  Neck: Normal range of motion and phonation normal. Neck supple.  Cardiovascular: Normal rate, regular rhythm and normal heart sounds.   Pulmonary/Chest: Effort normal. He exhibits no bony tenderness.  Decreased air movement bilaterally, left greater than right, with scattered end-expiratory wheezes. No rhonchi or rales.  Abdominal: Soft. There is no tenderness.  Musculoskeletal: Normal range of motion.  1+ lower extremity edema, bilaterally.  Neurological: He is alert and oriented to person, place, and time. No cranial nerve deficit or sensory deficit. He exhibits normal muscle tone. Coordination normal.  Skin: Skin is warm, dry and intact.  Psychiatric: He has a normal mood and affect. His behavior is normal. Judgment and thought content normal.  Nursing note and vitals reviewed.   ED Course  Procedures (including critical care time)  Medications  albuterol (PROVENTIL) (2.5 MG/3ML) 0.083% nebulizer solution (not administered)  albuterol (PROVENTIL) (2.5 MG/3ML) 0.083% nebulizer solution 5 mg (5 mg Nebulization Given 09/08/14 1635)  ipratropium (ATROVENT) nebulizer solution 0.5 mg (0.5 mg Nebulization Given 09/08/14 1635)  predniSONE (DELTASONE) tablet 60 mg (60 mg Oral Given 09/08/14 1616)    Patient Vitals for the  past 24 hrs:  BP Temp Temp src Pulse Resp SpO2  09/08/14 1738 - 98.9 F (37.2 C) Rectal - - -  09/08/14 1638 - - - - - 98 %  09/08/14 1520 113/68 mmHg 97.7 F (36.5 C) Oral 76 19 97 %   17:18- consultation, page to, pulmonary, to assist with establishment of care plan, urgently and in hospital. Discussed with Dr. Corinna Lines, pulmonary critical care, who advises that the patient does not need aggressive treatment for sepsis, or antibiotics at this time, and recommends small IV fluid bolus, followed by IV fluids at 125 cc per hour. He does not recommend high-volume bolus, 30 cc/kg. He recommends that the patient be admitted to the hospitalist service. He states that the evening physician, will continue to follow along, after admission.  6:00 PM-Consult complete with Hospitalist. Patient case explained and discussed. She agrees to admit patient for further evaluation and treatment. Call ended  at 18:30  5:43 PM Reevaluation with update and discussion. After initial assessment and treatment, an updated evaluation reveals clinical status is unchanged at this time. [place] Review Labs Reviewed  BASIC METABOLIC PANEL - Abnormal; Notable for the following:    Potassium 5.3 (*)    Chloride 93 (*)    CO2 19 (*)    Glucose, Bld 61 (*)    BUN 79 (*)    Creatinine, Ser 2.14 (*)    Calcium 10.5 (*)    GFR calc non Af Amer 27 (*)    GFR calc Af Amer 32 (*)    Anion gap 23 (*)    All other components within normal limits  CBC WITH DIFFERENTIAL/PLATELET - Abnormal; Notable for the following:    WBC 14.0 (*)    Hemoglobin 11.8 (*)    HCT 36.4 (*)    MCV 73.4 (*)    MCH 23.8 (*)    RDW 17.2 (*)    Neutrophils Relative % 85 (*)    Neutro Abs 12.0 (*)    Lymphocytes Relative 5 (*)    Lymphs Abs 0.6 (*)    Monocytes Absolute 1.2 (*)    All other components within normal limits  I-STAT CG4 LACTIC ACID, ED - Abnormal; Notable for the following:    Lactic Acid, Venous 13.44 (*)    All  other components within normal limits  URINE CULTURE  CULTURE, BLOOD (ROUTINE X 2)  CULTURE, BLOOD (ROUTINE X 2)  TROPONIN I  URINALYSIS, ROUTINE W REFLEX MICROSCOPIC (NOT AT [facility])    Imaging Review Dg Chest 2 View  09/08/2014   CLINICAL DATA:  Shortness of breath for about a month. Abdominal pain. Diagnosed with a "Lung mass about a week ago" .  EXAM: CHEST  2 VIEW  COMPARISON:  Plain films 08/22/2014.  CT of 08/31/2014  FINDINGS: Mild right hemidiaphragm elevation. Single lead pacer tip at right ventricle. Patient rotated minimally left. Midline trachea. Mild cardiomegaly. Atherosclerosis in the transverse aorta. No pleural effusion or pneumothorax. Dense left lower lobe opacity again identified, similar. Cutoff of the left lower lobe bronchus.  IMPRESSION: No change since 08/22/2014.  Left lower lobe opacity, better evaluated on 08/31/2014 CT.  Cardiomegaly and atherosclerosis.   Electronically Signed   By: Abigail Miyamoto M.D.   On: 09/08/2014 17:20   I have personally reviewed and evaluated these images and lab results as part of my medical decision-making.   EKG Interpretation   Date/Time:  Friday September 08 2014 15:33:29 EDT Ventricular Rate:  75 PR Interval:  172 QRS Duration: 162 QT Interval:  435 QTC Calculation: 486 R Axis:   -83 Text Interpretation:  Ventricular-paced rhythm No further analysis  attempted due to paced rhythm since last tracing no significant change  Confirmed by Eulis Foster  MD, Vira Agar ([id number]) on 09/08/2014 3:42:09 PM      MDM   Final diagnoses:  Malignant neoplasm of lower lobe of left lung  Dehydration  Hyperkalemia    Shortness of breath, with left lung mass, comparing CT from 8/18, T chest x-ray today. This does not appear to be any significant change in this mass. Elevated lactate is nonspecific. Patient does not have fever today. It is unlikely that this represents a sepsis. Consultation with pulmonary critical care for assistance with evaluation and  treatment.  Nursing Notes Reviewed/ Care Coordinated, and agree without changes. Applicable Imaging Reviewed.  Interpretation of Laboratory Data incorporated into ED treatment  Plan: Admit  Daleen Bo, MD 09/11/14 1700

## 2014-09-09 ENCOUNTER — Inpatient Hospital Stay (HOSPITAL_COMMUNITY): Payer: Commercial Managed Care - HMO

## 2014-09-09 DIAGNOSIS — R630 Anorexia: Secondary | ICD-10-CM

## 2014-09-09 DIAGNOSIS — I5022 Chronic systolic (congestive) heart failure: Secondary | ICD-10-CM

## 2014-09-09 DIAGNOSIS — R160 Hepatomegaly, not elsewhere classified: Secondary | ICD-10-CM

## 2014-09-09 DIAGNOSIS — C787 Secondary malignant neoplasm of liver and intrahepatic bile duct: Secondary | ICD-10-CM | POA: Diagnosis present

## 2014-09-09 DIAGNOSIS — R634 Abnormal weight loss: Secondary | ICD-10-CM

## 2014-09-09 DIAGNOSIS — C3490 Malignant neoplasm of unspecified part of unspecified bronchus or lung: Secondary | ICD-10-CM

## 2014-09-09 DIAGNOSIS — C3432 Malignant neoplasm of lower lobe, left bronchus or lung: Secondary | ICD-10-CM | POA: Insufficient documentation

## 2014-09-09 DIAGNOSIS — N170 Acute kidney failure with tubular necrosis: Secondary | ICD-10-CM

## 2014-09-09 DIAGNOSIS — R0600 Dyspnea, unspecified: Secondary | ICD-10-CM

## 2014-09-09 DIAGNOSIS — C801 Malignant (primary) neoplasm, unspecified: Secondary | ICD-10-CM

## 2014-09-09 DIAGNOSIS — R609 Edema, unspecified: Secondary | ICD-10-CM

## 2014-09-09 DIAGNOSIS — C7951 Secondary malignant neoplasm of bone: Secondary | ICD-10-CM

## 2014-09-09 LAB — BASIC METABOLIC PANEL
Anion gap: 21 — ABNORMAL HIGH (ref 5–15)
Anion gap: 22 — ABNORMAL HIGH (ref 5–15)
Anion gap: 29 — ABNORMAL HIGH (ref 5–15)
BUN: 77 mg/dL — ABNORMAL HIGH (ref 6–20)
BUN: 76 mg/dL — AB (ref 6–20)
BUN: 74 mg/dL — AB (ref 6–20)
CALCIUM: 9.8 mg/dL (ref 8.9–10.3)
CALCIUM: 9.5 mg/dL (ref 8.9–10.3)
CHLORIDE: 94 mmol/L — AB (ref 101–111)
CO2: 19 mmol/L — AB (ref 22–32)
CO2: 17 mmol/L — AB (ref 22–32)
CO2: 12 mmol/L — ABNORMAL LOW (ref 22–32)
CREATININE: 2.1 mg/dL — AB (ref 0.61–1.24)
CREATININE: 2.17 mg/dL — AB (ref 0.61–1.24)
CREATININE: 2.33 mg/dL — AB (ref 0.61–1.24)
Calcium: 9.9 mg/dL (ref 8.9–10.3)
Chloride: 97 mmol/L — ABNORMAL LOW (ref 101–111)
Chloride: 96 mmol/L — ABNORMAL LOW (ref 101–111)
GFR calc Af Amer: 31 mL/min — ABNORMAL LOW (ref >60)
GFR calc Af Amer: 29 mL/min — ABNORMAL LOW (ref >60)
GFR calc non Af Amer: 28 mL/min — ABNORMAL LOW (ref >60)
GFR calc non Af Amer: 27 mL/min — ABNORMAL LOW (ref >60)
GFR, EST AFRICAN AMERICAN: 33 mL/min — AB (ref >60)
GFR, EST NON AFRICAN AMERICAN: 25 mL/min — AB (ref >60)
GLUCOSE: 73 mg/dL (ref 65–99)
GLUCOSE: 97 mg/dL (ref 65–99)
GLUCOSE: 154 mg/dL — AB (ref 65–99)
Potassium: 5.4 mmol/L — ABNORMAL HIGH (ref 3.5–5.1)
Potassium: 4.6 mmol/L (ref 3.5–5.1)
Potassium: 4.8 mmol/L (ref 3.5–5.1)
Sodium: 134 mmol/L — ABNORMAL LOW (ref 135–145)
Sodium: 136 mmol/L (ref 135–145)
Sodium: 137 mmol/L (ref 135–145)

## 2014-09-09 LAB — COMPREHENSIVE METABOLIC PANEL
ALBUMIN: 2.4 g/dL — AB (ref 3.5–5.0)
ALK PHOS: 474 U/L — AB (ref 38–126)
AST: 110 U/L — AB (ref 15–41)
Anion gap: 23 — ABNORMAL HIGH (ref 5–15)
BUN: 77 mg/dL — AB (ref 6–20)
CALCIUM: 9.8 mg/dL (ref 8.9–10.3)
CHLORIDE: 94 mmol/L — AB (ref 101–111)
CO2: 17 mmol/L — AB (ref 22–32)
CREATININE: 2.13 mg/dL — AB (ref 0.61–1.24)
GFR calc non Af Amer: 28 mL/min — ABNORMAL LOW (ref >60)
GFR, EST AFRICAN AMERICAN: 32 mL/min — AB (ref >60)
GLUCOSE: 83 mg/dL (ref 65–99)
Potassium: 5.4 mmol/L — ABNORMAL HIGH (ref 3.5–5.1)
SODIUM: 134 mmol/L — AB (ref 135–145)
Total Bilirubin: 1.3 mg/dL — ABNORMAL HIGH (ref 0.3–1.2)
Total Protein: 6.2 g/dL — ABNORMAL LOW (ref 6.5–8.1)

## 2014-09-09 LAB — LACTIC ACID, PLASMA
Lactic Acid, Venous: 12.7 mmol/L (ref 0.5–2.0)
Lactic Acid, Venous: 13.5 mmol/L (ref 0.5–2.0)

## 2014-09-09 LAB — APTT
APTT: 29 s (ref 24–37)
APTT: 55 s — AB (ref 24–37)

## 2014-09-09 LAB — TROPONIN I
TROPONIN I: 0.03 ng/mL (ref <0.031)
Troponin I: 0.04 ng/mL — ABNORMAL HIGH (ref <0.031)
Troponin I: 0.03 ng/mL (ref <0.031)

## 2014-09-09 LAB — STREP PNEUMONIAE URINARY ANTIGEN: STREP PNEUMO URINARY ANTIGEN: NEGATIVE

## 2014-09-09 LAB — GLUCOSE, CAPILLARY
GLUCOSE-CAPILLARY: 75 mg/dL (ref 65–99)
GLUCOSE-CAPILLARY: 75 mg/dL (ref 65–99)
Glucose-Capillary: 109 mg/dL — ABNORMAL HIGH (ref 65–99)
Glucose-Capillary: 126 mg/dL — ABNORMAL HIGH (ref 65–99)
Glucose-Capillary: 148 mg/dL — ABNORMAL HIGH (ref 65–99)
Glucose-Capillary: 71 mg/dL (ref 65–99)
Glucose-Capillary: 81 mg/dL (ref 65–99)

## 2014-09-09 LAB — PREALBUMIN: Prealbumin: 9.4 mg/dL — ABNORMAL LOW (ref 18–38)

## 2014-09-09 LAB — PROTIME-INR
INR: 1.3 (ref 0.00–1.49)
Prothrombin Time: 16.3 seconds — ABNORMAL HIGH (ref 11.6–15.2)

## 2014-09-09 LAB — CREATININE, URINE, RANDOM: CREATININE, URINE: 53.71 mg/dL

## 2014-09-09 LAB — HEPARIN LEVEL (UNFRACTIONATED): Heparin Unfractionated: 0.74 IU/mL — ABNORMAL HIGH (ref 0.30–0.70)

## 2014-09-09 LAB — SODIUM, URINE, RANDOM: SODIUM UR: 39 mmol/L

## 2014-09-09 MED ORDER — SODIUM CHLORIDE 0.9 % IV SOLN
INTRAVENOUS | Status: DC
  Administered 2014-09-09 – 2014-09-10 (×2): 250 mL via INTRAVENOUS

## 2014-09-09 MED ORDER — SODIUM POLYSTYRENE SULFONATE 15 GM/60ML PO SUSP
30.0000 g | Freq: Once | ORAL | Status: AC
  Administered 2014-09-09: 30 g via ORAL
  Filled 2014-09-09: qty 120

## 2014-09-09 MED ORDER — TECHNETIUM TO 99M ALBUMIN AGGREGATED: 6.0000 | Freq: Once | INTRAVENOUS | Status: DC | PRN

## 2014-09-09 MED ORDER — PNEUMOCOCCAL VAC POLYVALENT 25 MCG/0.5ML IJ INJ
0.5000 mL | INJECTION | INTRAMUSCULAR | Status: AC
Start: 2014-09-10 — End: 2014-09-10
  Administered 2014-09-10: 0.5 mL via INTRAMUSCULAR
  Filled 2014-09-09 (×2): qty 0.5

## 2014-09-09 MED ORDER — HEPARIN (PORCINE) IN NACL 100-0.45 UNIT/ML-% IJ SOLN
1200.0000 [IU]/h | INTRAMUSCULAR | Status: DC
  Administered 2014-09-09: 1200 [IU]/h via INTRAVENOUS
  Filled 2014-09-09: qty 250

## 2014-09-09 MED ORDER — MORPHINE SULFATE (PF) 2 MG/ML IV SOLN
1.0000 mg | Freq: Once | INTRAVENOUS | Status: AC
  Administered 2014-09-09: 1 mg via INTRAVENOUS
  Filled 2014-09-09: qty 1

## 2014-09-09 MED ORDER — DEXTROSE-NACL 5-0.9 % IV SOLN: INTRAVENOUS | Status: DC

## 2014-09-09 MED ORDER — HEPARIN (PORCINE) IN NACL 100-0.45 UNIT/ML-% IJ SOLN
1100.0000 [IU]/h | INTRAMUSCULAR | Status: AC
  Administered 2014-09-10: 1400 [IU]/h via INTRAVENOUS
  Administered 2014-09-10: 1300 [IU]/h via INTRAVENOUS
  Administered 2014-09-11 – 2014-09-16 (×6): 1100 [IU]/h via INTRAVENOUS
  Filled 2014-09-09 (×15): qty 250

## 2014-09-09 MED ORDER — LEVOFLOXACIN IN D5W 750 MG/150ML IV SOLN
750.0000 mg | INTRAVENOUS | Status: DC
  Administered 2014-09-09 – 2014-09-11 (×2): 750 mg via INTRAVENOUS
  Filled 2014-09-09 (×2): qty 150

## 2014-09-09 MED ORDER — DEXTROSE 50 % IV SOLN
INTRAVENOUS | Status: AC
  Administered 2014-09-09: 25 mL
  Filled 2014-09-09: qty 50

## 2014-09-09 MED ORDER — HYDROCORTISONE NA SUCCINATE PF 100 MG IJ SOLR
50.0000 mg | Freq: Four times a day (QID) | INTRAMUSCULAR | Status: DC
  Administered 2014-09-09 – 2014-09-11 (×9): 50 mg via INTRAVENOUS
  Filled 2014-09-09: qty 1
  Filled 2014-09-09: qty 2
  Filled 2014-09-09 (×3): qty 1
  Filled 2014-09-09 (×4): qty 2
  Filled 2014-09-09: qty 1

## 2014-09-09 MED ORDER — HYDROCOD POLST-CPM POLST ER 10-8 MG/5ML PO SUER
5.0000 mL | Freq: Two times a day (BID) | ORAL | Status: DC | PRN
  Administered 2014-09-09: 5 mL via ORAL
  Filled 2014-09-09: qty 5

## 2014-09-09 MED ORDER — TECHNETIUM TC 99M DIETHYLENETRIAME-PENTAACETIC ACID: 40.0000 | Freq: Once | INTRAVENOUS | Status: DC | PRN

## 2014-09-09 MED ORDER — DEXTROSE-NACL 5-0.9 % IV SOLN
INTRAVENOUS | Status: DC
  Administered 2014-09-09: 19:00:00 via INTRAVENOUS
  Administered 2014-09-09: 1000 mL via INTRAVENOUS
  Administered 2014-09-10: 04:00:00 via INTRAVENOUS

## 2014-09-09 NOTE — Progress Notes (Signed)
ANTICOAGULATION CONSULT NOTE -Follow Up Consult  Pharmacy Consult for Heparin Indication: atrial fibrillation  Allergies  Allergen Reactions  . Amoxicillin Itching and Rash    Patient Measurements: Height: '5\' 9"'$  (175.3 cm) Weight: 201 lb 15.1 oz (91.6 kg) IBW/kg (Calculated) : 70.7 Heparin Dosing Weight: 89.3 kg  Vital Signs: Temp: 97.6 F (36.4 C) (08/27 2000) Temp Source: Axillary (08/27 2000) BP: 136/71 mmHg (08/27 2000) Pulse Rate: 84 (08/27 2000)  Labs:  Recent Labs  09/08/14 1604 09/08/14 2200 09/09/14 0400 09/09/14 1220 09/09/14 2022  HGB 11.8*  --   --   --   --   HCT 36.4*  --   --   --   --   PLT 184  --   --   --   --   APTT  --   --  29  --  55*  LABPROT 15.2  --  16.3*  --   --   INR 1.18  --  1.30  --   --   HEPARINUNFRC  --   --   --  0.74*  --   CREATININE 2.14* 2.10* 2.13* 2.17*  --   CKTOTAL 24*  --   --   --   --   TROPONINI 0.03 0.04* 0.03 0.03  --     Estimated Creatinine Clearance: 30.4 mL/min (by C-G formula based on Cr of 2.17).   Medical History: Past Medical History  Diagnosis Date  . High blood pressure   . Irregular heart rhythm   . Asthma   . Diabetes mellitus   . High cholesterol   . Kidney disease   . Sleep apnea   . Sickle cell trait   . A-fib   . Complete heart block   . Pacemaker     Medications:  Scheduled:  . carvedilol  12.5 mg Oral BID WC  . guaiFENesin  600 mg Oral BID  . hydrocortisone sodium succinate  50 mg Intravenous Q6H  . insulin aspart  0-9 Units Subcutaneous 6 times per day  . ipratropium-albuterol  3 mL Nebulization TID  . levofloxacin (LEVAQUIN) IV  750 mg Intravenous Q48H  . [START ON 09/10/2014] pneumococcal 23 valent vaccine  0.5 mL Intramuscular Tomorrow-1000  . polyethylene glycol  17 g Oral Daily  . vancomycin  1,000 mg Intravenous Q24H   Infusions:  . sodium chloride 250 mL (09/09/14 1245)  . dextrose 5 % and 0.9% NaCl 125 mL/hr at 09/09/14 1857  . heparin      Assessment: 82 year  old male recently diagnosed with left lower lobe lung mass with metastasis to the liver, s/p biopsy on 09/05/14.  He was admitted 09/08/14 with worsening shortness of breath and weakness.    Patient is taking apixaban (eliquis) for chronic Afib.  Given ARF and compromised hepatic function, CCM has consulted Pharmacy to transition to IV heparin. Also checking VQ scan to r/o PE (low suspicion) and LE dopplers to r/o DVT.  Last dose of apixaban documented last night 8/26 at 23:25.  PM Update:  APTT = 55sec on heparin @ 1200 units/hr (subtherapeutic)  No complications of therapy noted  Goal of Therapy:  Heparin level 0.3-0.7 units/ml  APTT 66-102 seconds Monitor platelets by anticoagulation protocol: Yes   Plan:   Increase Heparin IV infusion to 1400 units/hr  Check aPTT 8 hours after increasing heparin rate  Check daily heparin level and aPTT until levels correlate, then may titrate heparin using heparin levels only  Check daily  CBC  Leone Haven, PharmD  09/09/2014,9:24 PM

## 2014-09-09 NOTE — Progress Notes (Signed)
PROGRESS NOTE  Bradley Rasmussen PFX:[id number] DOB: 30-Jun-1931 DOA: 09/08/2014 PCP: Marco Collie, MD  HPI/Recap of past 50 hours: 81 year old male with past mental history of recently diagnosed metastatic cancer of unknown primary admitted on the evening of 8/26 with progressively worsening dyspnea on exertion and acute renal failure. Initially thought to be in septic shock from postobstructive pneumonia. Seen by critical care.  Assessment/Plan: Active Problems:   Shortness of breath/dyspnea on exertion: Workup in progress. Likely from lung metastases plus underlying COPD from smoking. Patient is currently comfortable on room air Acute kidney injury in the setting of stage III chronic kidney disease: Likely from hypotension from dehydration. Continue to monitor   Hyperkalemia: Noted on admission. Currently stable.    Lactic acidosis: Not sepsis. Lactic acidosis secondary liver metastases   Systolic CHF, chronic: Stable   Metastatic carcinoma involving liver with unknown primary site: Oncology seen   Code Status: Full code  Family Communication: Son at the bedside  Disposition Plan: Continue step down for one more day.   Consultants:  Oncology  Critical care  Procedures:  None  Antibiotics:  Aztreonam 8/26-8/27  Levaquin 8/26-present  Vancomycin 8/2 26-present   Objective: BP 122/71 mmHg  Pulse 83  Temp(Src) 97.5 F (36.4 C) (Oral)  Resp 20  Ht '5\' 9"'$  (1.753 m)  Wt 91.6 kg (201 lb 15.1 oz)  BMI 29.81 kg/m2  SpO2 96%  Intake/Output Summary (Last 24 hours) at 09/09/14 1506 Last data filed at 09/09/14 1200  Gross per 24 hour  Intake    825 ml  Output    300 ml  Net    525 ml   Filed Weights   09/08/14 2050 09/09/14 0543  Weight: 91.1 kg (200 lb 13.4 oz) 91.6 kg (201 lb 15.1 oz)    Exam:   General:  Alert and oriented 3  Cardiovascular: Regular rate and rhythm, S1-S2  Respiratory: Moderate inspiratory effort, decreased breath sounds  throughout  Abdomen: Soft, distended, and nontender, positive bowel sounds  Musculoskeletal: No clubbing or cyanosis, trace edema   Data Reviewed: Basic Metabolic Panel:  Recent Labs Lab 09/08/14 1604 09/08/14 1806 09/08/14 2200 09/09/14 0400 09/09/14 1220  NA 135  --  134* 134* 136  K 5.3* 5.4* 5.4* 5.4* 4.6  CL 93*  --  94* 94* 97*  CO2 19*  --  19* 17* 17*  GLUCOSE 61*  --  73 83 97  BUN 79*  --  77* 77* 76*  CREATININE 2.14*  --  2.10* 2.13* 2.17*  CALCIUM 10.5*  --  9.9 9.8 9.8   Liver Function Tests:  Recent Labs Lab 09/08/14 1604 09/09/14 0400  AST 115* 110*  ALT 47 <5*  ALKPHOS 490* 474*  BILITOT 1.0 1.3*  PROT 6.7 6.2*  ALBUMIN 2.5* 2.4*   No results for input(s): LIPASE, AMYLASE in the last 168 hours. No results for input(s): AMMONIA in the last 168 hours. CBC:  Recent Labs Lab 09/05/14 0852 09/08/14 1604  WBC 13.2* 14.0*  NEUTROABS  --  12.0*  HGB 11.7* 11.8*  HCT 35.1* 36.4*  MCV 71.9* 73.4*  PLT 181 184   Cardiac Enzymes:    Recent Labs Lab 09/08/14 1604 09/08/14 2200 09/09/14 0400 09/09/14 1220  CKTOTAL 24*  --   --   --   TROPONINI 0.03 0.04* 0.03 0.03   BNP (last 3 results) No results for input(s): BNP in the last 8760 hours.  ProBNP (last 3 results) No results for input(s): PROBNP in the  last 8760 hours.  CBG:  Recent Labs Lab 09/08/14 2050 09/09/14 0035 09/09/14 0500 09/09/14 0736 09/09/14 1153  GLUCAP 75 109* 81 71 75    Recent Results (from the past 240 hour(s))  Culture, blood (routine x 2)     Status: None (Preliminary result)   Collection Time: 09/08/14  4:27 PM  Result Value Ref Range Status   Specimen Description BLOOD RIGHT ANTECUBITAL  Final   Special Requests BOTTLES DRAWN AEROBIC AND ANAEROBIC 5 CC EA  Final   Culture   Final    NO GROWTH < 24 HOURS Performed at [facility]    Report Status PENDING  Incomplete  Culture, blood (routine x 2)     Status: None (Preliminary result)    Collection Time: 09/08/14  4:27 PM  Result Value Ref Range Status   Specimen Description BLOOD RIGHT HAND  Final   Special Requests BOTTLES DRAWN AEROBIC AND ANAEROBIC 5 CC EA  Final   Culture   Final    NO GROWTH < 24 HOURS Performed at [facility]    Report Status PENDING  Incomplete  Urine culture     Status: None (Preliminary result)   Collection Time: 09/08/14  5:39 PM  Result Value Ref Range Status   Specimen Description URINE, CLEAN CATCH  Final   Special Requests Normal  Final   Culture   Final    NO GROWTH < 24 HOURS Performed at [facility]    Report Status PENDING  Incomplete  MRSA PCR Screening     Status: None   Collection Time: 09/08/14  9:02 PM  Result Value Ref Range Status   MRSA by PCR NEGATIVE NEGATIVE Final    Comment:        The GeneXpert MRSA Assay (FDA approved for NASAL specimens only), is one component of a comprehensive MRSA colonization surveillance program. It is not intended to diagnose MRSA infection nor to guide or monitor treatment for MRSA infections.      Studies: Dg Chest 2 View  09/08/2014   CLINICAL DATA:  Shortness of breath for about a month. Abdominal pain. Diagnosed with a "Lung mass about a week ago" .  EXAM: CHEST  2 VIEW  COMPARISON:  Plain films 08/22/2014.  CT of 08/31/2014  FINDINGS: Mild right hemidiaphragm elevation. Single lead pacer tip at right ventricle. Patient rotated minimally left. Midline trachea. Mild cardiomegaly. Atherosclerosis in the transverse aorta. No pleural effusion or pneumothorax. Dense left lower lobe opacity again identified, similar. Cutoff of the left lower lobe bronchus.  IMPRESSION: No change since 08/22/2014.  Left lower lobe opacity, better evaluated on 08/31/2014 CT.  Cardiomegaly and atherosclerosis.   Electronically Signed   By: Abigail Miyamoto M.D.   On: 09/08/2014 17:20   Ct Head Wo Contrast  09/08/2014   CLINICAL DATA:  Shortness of breath for 1 month. Double vision. A biopsy has  shown that the patient has cancer but the patient has not been told what type.  EXAM: CT HEAD WITHOUT CONTRAST  TECHNIQUE: Contiguous axial images were obtained from the base of the skull through the vertex without intravenous contrast.  COMPARISON:  08/22/2014  FINDINGS: Ventricles and sulci appear symmetrical. No mass effect or midline shift. No abnormal extra-axial fluid collections. Gray-white matter junctions are distinct. Basal cisterns are not effaced. No evidence of acute intracranial hemorrhage. No depressed skull fractures. Retention cysts in the sphenoid sinus. Mastoid air cells are not opacified.  IMPRESSION: No acute intracranial abnormalities.  Electronically Signed   By: Lucienne Capers M.D.   On: 09/08/2014 20:29   Nm Pulmonary Perf And Vent  09/09/2014   CLINICAL DATA:  Shortness of breath. Leg pain. Lung mass. Evaluate for pulmonary embolism.  EXAM: NUCLEAR MEDICINE VENTILATION - PERFUSION LUNG SCAN  TECHNIQUE: Ventilation images were obtained in multiple projections using inhaled aerosol Tc-86mDTPA. Perfusion images were obtained in multiple projections after intravenous injection of Tc-948mAA.  RADIOPHARMACEUTICALS:  40 Technetium-9973mPA aerosol inhalation and 6 Technetium-49m64m IV  COMPARISON:  Chest CT - 08/31/2014 ; chest radiograph - 09/08/2014; 08/22/2014  FINDINGS: Chest radiograph performed 09/08/2014 demonstrates grossly unchanged enlarged cardiac silhouette and mediastinal contours with atherosclerotic plaque within the thoracic aorta. Stable positioning of left anterior chest wall single lead pacemaker with lead tip overlying expected location of the right ventricular apex. Dense left basilar consolidative opacities compatible with near collapse of the left lower lobe secondary to suspected endobronchial or pulmonary malignancy.  Ventilation: There is clumping of inhaled radiotracer about the pulmonary hila with expected preferential ventilation of the right lung in  comparison to the left. There is decreased ventilation of the left lower lobe compatible with the known left lower lobe pulmonary mass. Photopenic defect is seen at the location of the patient's left anterior chest wall pacemaker power pack. Ingested radiotracer is seen within the hypopharynx.  Perfusion: There is relative homogeneous distribution of injected radiotracer throughout the pulmonary parenchyma with expected matched area of non perfusion involving the left lower lobe at the location of the patient's known left lower lobe pulmonary mass. No discrete mismatched areas of perfusion within the pulmonary parenchyma. Expected focal attenuation is seen at the patient's left anterior chest wall pacemaker power pack site.  IMPRESSION: Triple matched defect involving the left lower lobe at the location of the patient's known left lower lobe endobronchial/perihilar mass. Given the presence of a triple matched defect, technically this CT scan could be characterized as intermediate probability, though would favor this is an expected VQ scan given findings on recently obtained chest CT obtained 08/31/2014 and give lack of any additional mismatched perfusion defects would favor this represents a low probability VQ scan. Clinical correlation is advised. Further evaluation could be performed with lower extremity venous Doppler ultrasound as clinically indicated.  Critical Value/emergent results were called by telephone at the time of interpretation on 09/09/2014 at 11:06 am to Dr. FeinTitus Mouldo verbally acknowledged these results.   Electronically Signed   By: JohnSandi Mariscal.   On: 09/09/2014 11:13    Scheduled Meds: . carvedilol  12.5 mg Oral BID WC  . guaiFENesin  600 mg Oral BID  . hydrocortisone sodium succinate  50 mg Intravenous Q6H  . insulin aspart  0-9 Units Subcutaneous 6 times per day  . ipratropium-albuterol  3 mL Nebulization TID  . levofloxacin (LEVAQUIN) IV  750 mg Intravenous Q48H  .  polyethylene glycol  17 g Oral Daily  . vancomycin  1,000 mg Intravenous Q24H    Continuous Infusions: . sodium chloride 250 mL (09/09/14 1245)  . dextrose 5 % and 0.9% NaCl 1,000 mL (09/09/14 0904)  . heparin 1,200 Units/hr (09/09/14 1245)     Time spent: 25 minutes  [facility] Pager [phone] 7PM-7AM, please contact night-coverage at [email or web address], password [email or web address], 3:06 PM  LOS: 1 day

## 2014-09-09 NOTE — Progress Notes (Signed)
Continues to decline nocturnal CPAP. RT will continue to follow.

## 2014-09-09 NOTE — Progress Notes (Signed)
  Echocardiogram 2D Echocardiogram has been performed.  Lysle Rubens 09/09/2014, 11:30 AM

## 2014-09-09 NOTE — Progress Notes (Signed)
CRITICAL VALUE ALERT  Critical value received:  Lactic acid  Date of notification:  09/09/14  Time of notification:  0513  Critical value read back:Yes.    Nurse who received alert:  Toy Baker, RN   MD notified (1st page):  M.Donnal Debar , NP  Time of first page:  [phone]  MD notified (2nd page):  Time of second page:  Responding MD:  M.Donnal Debar, NP  Time MD responded:  [phone].

## 2014-09-09 NOTE — Progress Notes (Signed)
CRITICAL VALUE ALERT  Critical value received:  Lactic acid 12.7  Date of notification:  09/09/14  Time of notification:  0218  Critical value read back:Yes.    Nurse who received alert:  Toy Baker, RN  MD notified (1st page):  M. Donnal Debar, NP  Time of first page: 0218  MD notified (2nd page):  Time of second page:  Responding MD:  M.Donnal Debar, NP. No new orders given.

## 2014-09-09 NOTE — Progress Notes (Signed)
ANTICOAGULATION CONSULT NOTE - Initial Consult  Pharmacy Consult for Heparin Indication: atrial fibrillation  Allergies  Allergen Reactions  . Amoxicillin Itching and Rash    Patient Measurements: Height: '5\' 9"'$  (175.3 cm) Weight: 201 lb 15.1 oz (91.6 kg) IBW/kg (Calculated) : 70.7 Heparin Dosing Weight: 89.3 kg  Vital Signs: Temp: 98.4 F (36.9 C) (08/27 0800) Temp Source: Oral (08/27 0800) BP: 119/69 mmHg (08/27 0759) Pulse Rate: 85 (08/27 0800)  Labs:  Recent Labs  09/08/14 1604 09/08/14 2200 09/09/14 0400  HGB 11.8*  --   --   HCT 36.4*  --   --   PLT 184  --   --   APTT  --   --  29  LABPROT 15.2  --  16.3*  INR 1.18  --  1.30  CREATININE 2.14* 2.10* 2.13*  CKTOTAL 24*  --   --   TROPONINI 0.03 0.04* 0.03    Estimated Creatinine Clearance: 30.9 mL/min (by C-G formula based on Cr of 2.13).   Medical History: Past Medical History  Diagnosis Date  . High blood pressure   . Irregular heart rhythm   . Asthma   . Diabetes mellitus   . High cholesterol   . Kidney disease   . Sleep apnea   . Sickle cell trait   . A-fib   . Complete heart block   . Pacemaker     Medications:  Scheduled:  . carvedilol  12.5 mg Oral BID WC  . guaiFENesin  600 mg Oral BID  . hydrocortisone sodium succinate  50 mg Intravenous Q6H  . insulin aspart  0-9 Units Subcutaneous 6 times per day  . ipratropium-albuterol  3 mL Nebulization TID  . levofloxacin (LEVAQUIN) IV  750 mg Intravenous Q48H  . polyethylene glycol  17 g Oral Daily  . sodium polystyrene  30 g Oral Once  . vancomycin  1,000 mg Intravenous Q24H   Infusions:  . dextrose 5 % and 0.9% NaCl 1,000 mL (09/09/14 0904)    Assessment: 81 year old male recently diagnosed with left lower lobe lung mass with metastasis to the liver, s/p biopsy on 09/05/14.  He was admitted 09/08/14 with worsening shortness of breath and weakness.    Patient is taking apixaban (eliquis) for chronic Afib.  Given ARF and compromised  hepatic function, CCM has consulted Pharmacy to transition to IV heparin. Also checking VQ scan to r/o PE (low suspicion) and LE dopplers to r/o DVT.  Last dose of apixaban documented last night 8/26 at 23:25.  Baseline labs: INR: 1.3 APTT: 29 HL: pending (likely to be >0.1)  Goal of Therapy:  Heparin level 0.3-0.7 units/ml  APTT 66-102 seconds Monitor platelets by anticoagulation protocol: Yes   Plan:  12 hours after last apixaban dose:  No heparin bolus  Heparin IV infusion 1200 units/hr  Check aPTT 8 hours after starting heparin  Check daily heparin level and aPTT until levels correlate, then may titrate heparin using heparin levels only  Check daily CBC  Peggyann Juba, PharmD, BCPS Pager: [phone] 09/09/2014,8:57 AM

## 2014-09-09 NOTE — Progress Notes (Signed)
VASCULAR LAB PRELIMINARY  PRELIMINARY  PRELIMINARY  PRELIMINARY  Bilateral lower extremity venous duplex completed.    Preliminary report:  There is no DVT or SVT noted in the bilateral lower extremities.   Rashmi Tallent, RVT 09/09/2014, 12:10 PM

## 2014-09-09 NOTE — Consult Note (Signed)
PULMONARY / CRITICAL CARE MEDICINE   Name: Bradley Rasmussen MRN: [id number] DOB: [birth year]    ADMISSION DATE:  09/08/2014 CONSULTATION DATE:  09/09/14  REFERRING MD :  [facility]  CHIEF COMPLAINT:  Lactic acidosis, SOB  INITIAL PRESENTATION:  Recent Bx Liver with cancer, SOB, now lactic acidosis  STUDIES:  CT chest 8/18>>>Worsening LLL atx concerning for obstruction, mass, liver mets, Lymphadenopathy CT head 8/27>>>neg  SIGNIFICANT EVENTS: 8/23- liver bx, left mass lung on CT chest on 18 th worsening atx 8/26- admitted worsening sob, lactic acidosis  HISTORY OF PRESENT ILLNESS:  81 yr old male where 2 Weeks ago patient was diagnosed with left lower lobe lung mass concerning for obstruciton with metastasis to the liver. Patient has 40 pack years smoking history. Patient has been followed by pulmonology Dr [place] Bells. Has not established care with oncology yet. On 23 of August patient undergone liver lesion biopsy results showing poorly differentiated malignant lesion.  Presented with progressively worsening shortness of breath and generalized weakness. Patient reports gradual worsening in shortness of breath. He has been having some abdominal pain. Also noted some issues swallowing. This has been getting worse over past few days.  On admission noted alk phos elevation, WBC up and lactic acidosis. Worsening renal fxn.  PAST MEDICAL HISTORY :   has a past medical history of High blood pressure; Irregular heart rhythm; Asthma; Diabetes mellitus; High cholesterol; Kidney disease; Sleep apnea; Sickle cell trait; A-fib; Complete heart block; and Pacemaker.  has past surgical history that includes left lens implant (07/1994); left rotator cuff (03/1995); Pacemaker insertion (03/1997); Penile prosthesis implant (08/1997); right lens implant (06/1999); pacemaker replacement (11/2006); and Circumcision (03/2008). Prior to Admission medications   Medication Sig Start Date End Date Taking? Authorizing Provider   allopurinol (ZYLOPRIM) 100 MG tablet Take 50 mg by mouth daily.    Yes Historical Provider, MD  apixaban (ELIQUIS) 2.5 MG TABS tablet Take 2.5 mg by mouth 2 (two) times daily.   Yes Historical Provider, MD  carvedilol (COREG) 12.5 MG tablet Take 12.5 mg by mouth 2 (two) times daily with a meal.   Yes Historical Provider, MD  Cholecalciferol (D-3-5) 5000 UNITS capsule Take 5,000 Units by mouth daily.   Yes Historical Provider, MD  docusate sodium (COLACE) 100 MG capsule Take 100 mg by mouth every 6 (six) hours as needed for mild constipation.   Yes Historical Provider, MD  doxazosin (CARDURA) 8 MG tablet Take 8 mg by mouth daily.   Yes Historical Provider, MD  finasteride (PROSCAR) 5 MG tablet Take 5 mg by mouth daily.   Yes Historical Provider, MD  furosemide (LASIX) 40 MG tablet Take 20 mg by mouth 2 (two) times daily.    Yes Historical Provider, MD  glipiZIDE (GLUCOTROL) 5 MG tablet Take by mouth daily before breakfast.   Yes Historical Provider, MD  metolazone (ZAROXOLYN) 5 MG tablet Take 2.5 mg by mouth daily. 07/27/14  Yes Historical Provider, MD  oxyCODONE-acetaminophen (PERCOCET/ROXICET) 5-325 MG per tablet Take 1 tablet by mouth every 6 (six) hours as needed for severe pain. 09/05/14  Yes [name], MD  polyethylene glycol (MIRALAX / GLYCOLAX) packet Take 17 g by mouth daily.   Yes Historical Provider, MD  potassium chloride SA (K-DUR,KLOR-CON) 20 MEQ tablet Take 20 mEq by mouth 3 (three) times daily.    Yes Historical Provider, MD  pravastatin (PRAVACHOL) 80 MG tablet Take 40 mg by mouth every evening.    Yes Historical Provider, MD  Respiratory Therapy Supplies (FLUTTER)  DEVI Use as directed 09/12/13  Yes [name], MD  oxyCODONE (OXY IR/ROXICODONE) 5 MG immediate release tablet Take 1 tablet (5 mg total) by mouth every 6 (six) hours as needed (pain). Patient not taking: Reported on 09/08/2014 08/29/14   [name], MD   Allergies  Allergen Reactions  . Amoxicillin  Itching and Rash    FAMILY HISTORY:  has no family status information on file.  denies clots SOCIAL HISTORY:  reports that he quit smoking about 26 years ago. His smoking use included Cigarettes. He has a 20 pack-year smoking history. He has never used smokeless tobacco. He reports that he does not drink alcohol or use illicit drugs.  REVIEW OF SYSTEMS: Review of Systems  Constitutional: Positive for weight loss and malaise/fatigue. Negative for fever, chills and diaphoresis.  HENT: Negative for congestion, ear discharge, ear pain, hearing loss, nosebleeds, sore throat and tinnitus.   Eyes: Negative for blurred vision, double vision, photophobia, pain, discharge and redness.  Respiratory: Positive for cough and shortness of breath. Negative for hemoptysis, sputum production, wheezing and stridor.   Cardiovascular: Positive for leg swelling. Negative for chest pain, palpitations, orthopnea, claudication and PND.  Gastrointestinal: Positive for nausea and abdominal pain. Negative for heartburn, vomiting, constipation, blood in stool and melena.  Genitourinary: Negative for dysuria, urgency, frequency, hematuria and flank pain.  Musculoskeletal: Negative for joint pain.  Skin: Negative for itching and rash.  Neurological: Positive for dizziness and weakness. Negative for headaches.  Endo/Heme/Allergies: Does not bruise/bleed easily.     SUBJECTIVE: glu 71, SOB is better  VITAL SIGNS: Temp:  [97.7 F (36.5 C)-98.9 F (37.2 C)] 97.8 F (36.6 C) (08/27 0400) Pulse Rate:  [76-85] 85 (08/27 0600) Resp:  [14-27] 14 (08/27 0600) BP: (113-137)/(68-79) 119/69 mmHg (08/27 0600) SpO2:  [95 %-99 %] 98 % (08/27 0600) Weight:  [91.1 kg (200 lb 13.4 oz)-91.6 kg (201 lb 15.1 oz)] 91.6 kg (201 lb 15.1 oz) (08/27 0543) HEMODYNAMICS:   VENTILATOR SETTINGS:   INTAKE / OUTPUT:  Intake/Output Summary (Last 24 hours) at 09/09/14 0740 Last data filed at 09/08/14 2250  Gross per 24 hour  Intake       0 ml  Output    300 ml  Net   -300 ml    PHYSICAL EXAMINATION: General:  No distress, not toxic Neuro:  Nonfocal, alert , nonfocal HEENT:  jvd  wnl Cardiovascular: s1 s2 rrr paced Lungs:  Left base reduced, slight coarse Abdomen:  Liver palpated, bs wnl, no r/g Musculoskeletal:  No rash, rt leg equal size left Skin:  No rash  LABS:  CBC  Recent Labs Lab 09/05/14 0852 09/08/14 1604  WBC 13.2* 14.0*  HGB 11.7* 11.8*  HCT 35.1* 36.4*  PLT 181 184   Coag's  Recent Labs Lab 09/05/14 0852 09/08/14 1604 09/09/14 0400  APTT 28  --  29  INR 1.16 1.18 1.30   BMET  Recent Labs Lab 09/08/14 1604 09/08/14 1806 09/08/14 2200 09/09/14 0400  NA 135  --  134* 134*  K 5.3* 5.4* 5.4* 5.4*  CL 93*  --  94* 94*  CO2 19*  --  19* 17*  BUN 79*  --  77* 77*  CREATININE 2.14*  --  2.10* 2.13*  GLUCOSE 61*  --  73 83   Electrolytes  Recent Labs Lab 09/08/14 1604 09/08/14 2200 09/09/14 0400  CALCIUM 10.5* 9.9 9.8   Sepsis Markers  Recent Labs Lab 09/08/14 2200 09/09/14 0051 09/09/14 0400  LATICACIDVEN 10.7* 12.7* 13.5*  PROCALCITON 1.70  --   --    ABG No results for input(s): PHART, PCO2ART, PO2ART in the last 168 hours. Liver Enzymes  Recent Labs Lab 09/08/14 1604 09/09/14 0400  AST 115* 110*  ALT 47 <5*  ALKPHOS 490* 474*  BILITOT 1.0 1.3*  ALBUMIN 2.5* 2.4*   Cardiac Enzymes  Recent Labs Lab 09/08/14 1604 09/08/14 2200 09/09/14 0400  TROPONINI 0.03 0.04* 0.03   Glucose  Recent Labs Lab 09/05/14 0852 09/05/14 1128 09/09/14 0035  GLUCAP 82 74 109*    Imaging Dg Chest 2 View  09/08/2014   CLINICAL DATA:  Shortness of breath for about a month. Abdominal pain. Diagnosed with a "Lung mass about a week ago" .  EXAM: CHEST  2 VIEW  COMPARISON:  Plain films 08/22/2014.  CT of 08/31/2014  FINDINGS: Mild right hemidiaphragm elevation. Single lead pacer tip at right ventricle. Patient rotated minimally left. Midline trachea. Mild cardiomegaly.  Atherosclerosis in the transverse aorta. No pleural effusion or pneumothorax. Dense left lower lobe opacity again identified, similar. Cutoff of the left lower lobe bronchus.  IMPRESSION: No change since 08/22/2014.  Left lower lobe opacity, better evaluated on 08/31/2014 CT.  Cardiomegaly and atherosclerosis.   Electronically Signed   By: Abigail Miyamoto M.D.   On: 09/08/2014 17:20   Ct Head Wo Contrast  09/08/2014   CLINICAL DATA:  Shortness of breath for 1 month. Double vision. A biopsy has shown that the patient has cancer but the patient has not been told what type.  EXAM: CT HEAD WITHOUT CONTRAST  TECHNIQUE: Contiguous axial images were obtained from the base of the skull through the vertex without intravenous contrast.  COMPARISON:  08/22/2014  FINDINGS: Ventricles and sulci appear symmetrical. No mass effect or midline shift. No abnormal extra-axial fluid collections. Gray-white matter junctions are distinct. Basal cisterns are not effaced. No evidence of acute intracranial hemorrhage. No depressed skull fractures. Retention cysts in the sphenoid sinus. Mastoid air cells are not opacified.  IMPRESSION: No acute intracranial abnormalities.   Electronically Signed   By: Lucienne Capers M.D.   On: 09/08/2014 20:29     ASSESSMENT / PLAN:  PULMONARY  A:Progressive Left lower lobe obstruction, r/o primary lung cancer, r/o post obstructive PNA, low suspicion PE, OSA P:   For VQ , reasonable with leg pain and cancer and sob , but came in ON elaquise pcxr in am for worsening ATX Will have to dicuss with Oncology options radiation Likely to require bronch Home cpap IS  CARDIOVASCULAR A: Lactic acidosis in setting extensive liver mets, NOT sepsis P:  Pos balance goals tele  RENAL A:  ARF, Hyponatremia, mild hyperkalemia, ATN, renal cysts on CT, lactic acidosis in setting Extensive liver mets P:   Would correlate renal US, r/o new obstruction bmet q8h May need bicarb for K kayxlate x 1 UA  reviewed No repeat la required  GASTROINTESTINAL A:  Dysphagia, r/o secondary to lymphadenopathy, LIver masses likely extensive mets, Alk phos elevation P:   Likely alk phos is from mets / liver mets May need egd in future Npo until vq  HEMATOLOGIC A:  Leukocytosis, low suspicion PE, leg swelling per pt history P:  I called pharmacy to consider heparin and dc elaquise given liver mets and ARF Get dopplers  INFECTIOUS A: Post obstructive PNA P:   BCx2 8/26>>> UC 8/26>>> Sputum 8/26>>> Abx Aztreonam 8/26>>>8/27 vanc 8/26>>> levoflox 8/26>>>  Add levofloxacin, dc aztreonam  ENDOCRINE A:  Borderline hypoglycemia, NPO status P:   Assess cortisol, then stress roids D50, add d5 to fluids  NEUROLOGIC A:  No mets noted P:   RASS goal: 0   FAMILY  - Updates:   - Inter-disciplinary family meet or Palliative Care meeting due by:9/2  I updated wife and pt in full  Lavon Paganini. Titus Mould, MD, FACP Pgr: [facility]  [facility]ulmonary and [place] Pager: [phone]  09/09/2014, 7:40 AM

## 2014-09-09 NOTE — Progress Notes (Signed)
ANTIBIOTIC CONSULT NOTE  Pharmacy Consult for Levaquin Indication: pneumonia  Allergies  Allergen Reactions  . Amoxicillin Itching and Rash    Patient Measurements: Height: '5\' 9"'$  (175.3 cm) Weight: 201 lb 15.1 oz (91.6 kg) IBW/kg (Calculated) : 70.7   Vital Signs: Temp: 98.4 F (36.9 C) (08/27 0800) Temp Source: Oral (08/27 0800) BP: 119/69 mmHg (08/27 0759) Pulse Rate: 85 (08/27 0800) Intake/Output from previous day: 08/26 0701 - 08/27 0700 In: -  Out: 300 [Urine:300] Intake/Output from this shift:    Labs:  Recent Labs  09/08/14 1604 09/08/14 2200 09/09/14 0400  WBC 14.0*  --   --   HGB 11.8*  --   --   PLT 184  --   --   CREATININE 2.14* 2.10* 2.13*   Estimated Creatinine Clearance: 30.9 mL/min (by C-G formula based on Cr of 2.13). No results for input(s): VANCOTROUGH, VANCOPEAK, VANCORANDOM, GENTTROUGH, GENTPEAK, GENTRANDOM, TOBRATROUGH, TOBRAPEAK, TOBRARND, AMIKACINPEAK, AMIKACINTROU, AMIKACIN in the last 72 hours.   Microbiology: Recent Results (from the past 720 hour(s))  MRSA PCR Screening     Status: None   Collection Time: 09/08/14  9:02 PM  Result Value Ref Range Status   MRSA by PCR NEGATIVE NEGATIVE Final    Comment:        The GeneXpert MRSA Assay (FDA approved for NASAL specimens only), is one component of a comprehensive MRSA colonization surveillance program. It is not intended to diagnose MRSA infection nor to guide or monitor treatment for MRSA infections.     Medical History: Past Medical History  Diagnosis Date  . High blood pressure   . Irregular heart rhythm   . Asthma   . Diabetes mellitus   . High cholesterol   . Kidney disease   . Sleep apnea   . Sickle cell trait   . A-fib   . Complete heart block   . Pacemaker     Assessment: 49 yoM admitted with sepsis 2/2 pneumonia.  Patient was recently diagnosed with lung mass with metastasis to the liver.  Blood and urine cultures collected.  Aztreonam and Vancomycin  initiated 8/26, now adding Levaquin 8/27 for post-obstructive pneumonia.  8/26 >> Vancomycin >> 8/26 >> Aztreonam >> 8/27 >> Levaquin >>  8/26 blood x2: sent 8/26 urine: sent 8/26 urine strep/legionella: neg/pending Sputum: ordered  Today, 09/09/2014 Afeb, WBC 14, AKI, CrCl 30 ml/min CG, 27 ml/min/1.17m (normalized), LA elevated/rising, PCT normal  Goal of Therapy:  Vancomycin trough level 15-20 mcg/ml  Doses adjusted per renal function Eradication of infection  Plan:   Levaquin '750mg'$  IV q48h for CrCl < 50 ml/min Follow up renal function & cultures, clinical course Continue vancomycin and aztreonam as previously ordered  EPeggyann Juba PharmD, BCPS Pager: [phone]/27/2016,8:12 AM

## 2014-09-10 ENCOUNTER — Inpatient Hospital Stay (HOSPITAL_COMMUNITY): Payer: Commercial Managed Care - HMO

## 2014-09-10 ENCOUNTER — Encounter: Payer: Self-pay | Admitting: Radiation Oncology

## 2014-09-10 DIAGNOSIS — R0600 Dyspnea, unspecified: Secondary | ICD-10-CM | POA: Insufficient documentation

## 2014-09-10 DIAGNOSIS — Z515 Encounter for palliative care: Secondary | ICD-10-CM | POA: Insufficient documentation

## 2014-09-10 DIAGNOSIS — E440 Moderate protein-calorie malnutrition: Secondary | ICD-10-CM | POA: Insufficient documentation

## 2014-09-10 DIAGNOSIS — C3432 Malignant neoplasm of lower lobe, left bronchus or lung: Secondary | ICD-10-CM | POA: Diagnosis not present

## 2014-09-10 LAB — URINE CULTURE: Special Requests: NORMAL

## 2014-09-10 LAB — BASIC METABOLIC PANEL
Anion gap: 27 — ABNORMAL HIGH (ref 5–15)
BUN: 75 mg/dL — AB (ref 6–20)
CHLORIDE: 97 mmol/L — AB (ref 101–111)
CO2: 13 mmol/L — AB (ref 22–32)
Calcium: 9.4 mg/dL (ref 8.9–10.3)
Creatinine, Ser: 2.31 mg/dL — ABNORMAL HIGH (ref 0.61–1.24)
GFR calc Af Amer: 29 mL/min — ABNORMAL LOW (ref >60)
GFR calc non Af Amer: 25 mL/min — ABNORMAL LOW (ref >60)
GLUCOSE: 128 mg/dL — AB (ref 65–99)
POTASSIUM: 4 mmol/L (ref 3.5–5.1)
Sodium: 137 mmol/L (ref 135–145)

## 2014-09-10 LAB — GLUCOSE, CAPILLARY
GLUCOSE-CAPILLARY: 138 mg/dL — AB (ref 65–99)
GLUCOSE-CAPILLARY: 144 mg/dL — AB (ref 65–99)
Glucose-Capillary: 134 mg/dL — ABNORMAL HIGH (ref 65–99)
Glucose-Capillary: 140 mg/dL — ABNORMAL HIGH (ref 65–99)
Glucose-Capillary: 151 mg/dL — ABNORMAL HIGH (ref 65–99)
Glucose-Capillary: 156 mg/dL — ABNORMAL HIGH (ref 65–99)

## 2014-09-10 LAB — CBC
HCT: 33.3 % — ABNORMAL LOW (ref 39.0–52.0)
HEMOGLOBIN: 10.8 g/dL — AB (ref 13.0–17.0)
MCH: 24.1 pg — AB (ref 26.0–34.0)
MCHC: 32.4 g/dL (ref 30.0–36.0)
MCV: 74.3 fL — AB (ref 78.0–100.0)
Platelets: 230 10*3/uL (ref 150–400)
RBC: 4.48 MIL/uL (ref 4.22–5.81)
RDW: 17.4 % — ABNORMAL HIGH (ref 11.5–15.5)
WBC: 19.7 10*3/uL — ABNORMAL HIGH (ref 4.0–10.5)

## 2014-09-10 LAB — APTT
APTT: 97 s — AB (ref 24–37)
aPTT: 80 seconds — ABNORMAL HIGH (ref 24–37)

## 2014-09-10 LAB — HEPARIN LEVEL (UNFRACTIONATED): Heparin Unfractionated: 0.65 IU/mL (ref 0.30–0.70)

## 2014-09-10 MED ORDER — SODIUM CHLORIDE 0.9 % IV SOLN
INTRAVENOUS | Status: DC
  Administered 2014-09-10: 1000 mL via INTRAVENOUS
  Administered 2014-09-11: 02:00:00 via INTRAVENOUS

## 2014-09-10 MED ORDER — INSULIN ASPART 100 UNIT/ML ~~LOC~~ SOLN
0.0000 [IU] | Freq: Three times a day (TID) | SUBCUTANEOUS | Status: DC
  Administered 2014-09-10: 1 [IU] via SUBCUTANEOUS
  Administered 2014-09-10: 2 [IU] via SUBCUTANEOUS
  Administered 2014-09-11 – 2014-09-12 (×4): 1 [IU] via SUBCUTANEOUS
  Administered 2014-09-14: 2 [IU] via SUBCUTANEOUS
  Administered 2014-09-14 – 2014-09-16 (×5): 1 [IU] via SUBCUTANEOUS

## 2014-09-10 MED ORDER — INSULIN ASPART 100 UNIT/ML ~~LOC~~ SOLN: 0.0000 [IU] | Freq: Every day | SUBCUTANEOUS | Status: DC

## 2014-09-10 NOTE — Progress Notes (Addendum)
ANTICOAGULATION CONSULT NOTE -Follow Up Consult  Pharmacy Consult for Heparin Indication: atrial fibrillation  Allergies  Allergen Reactions  . Amoxicillin Itching and Rash    Patient Measurements: Height: '5\' 9"'$  (175.3 cm) Weight: 212 lb 15.4 oz (96.6 kg) IBW/kg (Calculated) : 70.7 Heparin Dosing Weight: 89.3 kg  Vital Signs: Temp: 98 F (36.7 C) (08/28 0400) Temp Source: Oral (08/28 0400) BP: 130/77 mmHg (08/28 0600) Pulse Rate: 84 (08/28 0600)  Labs:  Recent Labs  09/08/14 1604 09/08/14 2200 09/09/14 0400 09/09/14 1220 09/09/14 2022 09/10/14 0524  HGB 11.8*  --   --   --   --  10.8*  HCT 36.4*  --   --   --   --  33.3*  PLT 184  --   --   --   --  230  APTT  --   --  29  --  55* 80*  LABPROT 15.2  --  16.3*  --   --   --   INR 1.18  --  1.30  --   --   --   HEPARINUNFRC  --   --   --  0.74*  --  0.65  CREATININE 2.14* 2.10* 2.13* 2.17* 2.33* 2.31*  CKTOTAL 24*  --   --   --   --   --   TROPONINI 0.03 0.04* 0.03 0.03  --   --     Estimated Creatinine Clearance: 29.3 mL/min (by C-G formula based on Cr of 2.31).   Medical History: Past Medical History  Diagnosis Date  . High blood pressure   . Irregular heart rhythm   . Asthma   . Diabetes mellitus   . High cholesterol   . Kidney disease   . Sleep apnea   . Sickle cell trait   . A-fib   . Complete heart block   . Pacemaker     Medications:  Scheduled:  . carvedilol  12.5 mg Oral BID WC  . guaiFENesin  600 mg Oral BID  . hydrocortisone sodium succinate  50 mg Intravenous Q6H  . insulin aspart  0-9 Units Subcutaneous 6 times per day  . ipratropium-albuterol  3 mL Nebulization TID  . levofloxacin (LEVAQUIN) IV  750 mg Intravenous Q48H  . pneumococcal 23 valent vaccine  0.5 mL Intramuscular Tomorrow-1000  . polyethylene glycol  17 g Oral Daily  . vancomycin  1,000 mg Intravenous Q24H   Infusions:  . sodium chloride 250 mL (09/09/14 1245)  . dextrose 5 % and 0.9% NaCl 125 mL/hr at 09/10/14 0400   . heparin 1,400 Units/hr (09/10/14 0500)    Assessment: 81 year old male recently diagnosed with left lower lobe lung mass with metastasis to the liver, s/p biopsy on 09/05/14.  He was admitted 09/08/14 with worsening shortness of breath and weakness.    Patient is taking apixaban (eliquis) for chronic Afib.  Given ARF and compromised hepatic function, CCM has consulted Pharmacy to transition to IV heparin. Also checking VQ scan to r/o PE (low suspicion) and LE dopplers to r/o DVT.  Last dose of apixaban documented 8/26 at 23:25.  Today, 09/10/2014  APTT = 80sec on heparin @ 1400 units/hr (therapeutic)  Heparin level = 0.65 (therapeutic)  Hgb slightly decreased to 10.8, Plts stable  No complications of therapy noted  Goal of Therapy:  Heparin level 0.3-0.7 units/ml  APTT 66-102 seconds Monitor platelets by anticoagulation protocol: Yes   Plan:   Continue Heparin IV infusion at 1400 units/hr  Recheck aPTT in 8 hrs to verify therapeutic  Check daily heparin level and aPTT; if they correlate again tomorrow, may stop checking aPTTs as the effects of apixaban have diminished and may titrate heparin using heparin levels only  Check daily CBC  Peggyann Juba, PharmD, BCPS Pager: [phone]  09/10/2014,7:18 AM  Addendum: APTT = 97 which is therapeutic but steadily trending up. No bleeding or complications reported per RN  Plan:  Decrease heparin to 1300 units/hr  Check heparin level, aPTT and CBC in AM as above  Peggyann Juba, PharmD, BCPS Pager: [phone] 09/10/2014 3:33 PM

## 2014-09-10 NOTE — Progress Notes (Signed)
PROGRESS NOTE  Bradley Rasmussen TIR:[id number] DOB: 1931-08-02 DOA: 09/08/2014 PCP: Marco Collie, MD  HPI/Recap of past 2 hours: 81 year old male with past mental history of recently diagnosed metastatic lung cancer admitted on the evening of 8/26 with progressively worsening dyspnea on exertion and acute renal failure. Initially thought to be in septic shock from postobstructive pneumonia. Seen by critical care.  Critical care clarified that his lactic acidosis was not from sepsis, but from his liver metastases. Patient's breathing significantly improved over the initial 24 hours.  Patient doing okay. No events overnight.   Assessment/Plan: Active Problems:   Shortness of breath/dyspnea on exertion: Workup in progress. Likely from Postobstructive lung mass  plus underlying COPD from smoking. Continue nebulizers  Acute kidney injury in the setting of stage III chronic kidney disease: Likely from hypotension from dehydration. not much improvement today. Had received Kayexalate yesterday. Increase IV fluids.    Hyperkalemia: Noted on admission.status post Kayexalate. Now resolved.    Lactic acidosis: Not sepsis. Lactic acidosis secondary liver metastases  Chronic systolic CHF. Monitoring daily weights, significant increased today. Still believed to be volume depleted so will not yet start diuretics    Metastatic lung carcinoma involving liver with unknown primary site: Oncology seeing.  Palliative radiation recommended. Radiation oncology to see.  Atrial fibrillation: Chads 2 vas score of 5. Initially on eliquis, but with liver metastases and renal dysfunction, changed to IV heparin.    diabetes mellitus controlled with renal disease: Normally on Glucotrol. Checking A1c. On stress dose steroids.  Code Status: Full code  Family Communication: wife at the bedside   Disposition Plan: transfer to floor.   Consultants:  Oncology  Critical  care  Procedures:  None  Antibiotics:  Aztreonam 8/26-8/27  Levaquin 8/26-present  Vancomycin 8/2 26-present   Objective: BP 116/82 mmHg  Pulse 81  Temp(Src) 98.5 F (36.9 C) (Oral)  Resp 16  Ht '5\' 9"'$  (1.753 m)  Wt 96.6 kg (212 lb 15.4 oz)  BMI 31.43 kg/m2  SpO2 98%  Intake/Output Summary (Last 24 hours) at 09/10/14 1032 Last data filed at 09/10/14 0900  Gross per 24 hour  Intake 4025.67 ml  Output    975 ml  Net 3050.67 ml   Filed Weights   09/08/14 2050 09/09/14 0543 09/10/14 0500  Weight: 91.1 kg (200 lb 13.4 oz) 91.6 kg (201 lb 15.1 oz) 96.6 kg (212 lb 15.4 oz)    Exam:   General:  Alert and oriented 3  Cardiovascular: Regular rate and rhythm, S1-S2  Respiratory: Moderate inspiratory effort, decreased breath sounds throughout, mild end expiratory wheeze bilaterally   Abdomen: Soft, distended, and nontender, positive bowel sounds  Musculoskeletal: No clubbing or cyanosis, trace edema   Data Reviewed: Basic Metabolic Panel:  Recent Labs Lab 09/08/14 2200 09/09/14 0400 09/09/14 1220 09/09/14 2022 09/10/14 0524  NA 134* 134* 136 137 137  K 5.4* 5.4* 4.6 4.8 4.0  CL 94* 94* 97* 96* 97*  CO2 19* 17* 17* 12* 13*  GLUCOSE 73 83 97 154* 128*  BUN 77* 77* 76* 74* 75*  CREATININE 2.10* 2.13* 2.17* 2.33* 2.31*  CALCIUM 9.9 9.8 9.8 9.5 9.4   Liver Function Tests:  Recent Labs Lab 09/08/14 1604 09/09/14 0400  AST 115* 110*  ALT 47 <5*  ALKPHOS 490* 474*  BILITOT 1.0 1.3*  PROT 6.7 6.2*  ALBUMIN 2.5* 2.4*   No results for input(s): LIPASE, AMYLASE in the last 168 hours. No results for input(s): AMMONIA in the last 168 hours. CBC:  Recent Labs Lab 09/05/14 0852 09/08/14 1604 09/10/14 0524  WBC 13.2* 14.0* 19.7*  NEUTROABS  --  12.0*  --   HGB 11.7* 11.8* 10.8*  HCT 35.1* 36.4* 33.3*  MCV 71.9* 73.4* 74.3*  PLT 181 184 230   Cardiac Enzymes:    Recent Labs Lab 09/08/14 1604 09/08/14 2200 09/09/14 0400 09/09/14 1220   CKTOTAL 24*  --   --   --   TROPONINI 0.03 0.04* 0.03 0.03   BNP (last 3 results) No results for input(s): BNP in the last 8760 hours.  ProBNP (last 3 results) No results for input(s): PROBNP in the last 8760 hours.  CBG:  Recent Labs Lab 09/09/14 1546 09/09/14 1937 09/09/14 2339 09/10/14 0345 09/10/14 0736  GLUCAP 126* 148* 156* 140* 138*    Recent Results (from the past 240 hour(s))  Culture, blood (routine x 2)     Status: None (Preliminary result)   Collection Time: 09/08/14  4:27 PM  Result Value Ref Range Status   Specimen Description BLOOD RIGHT ANTECUBITAL  Final   Special Requests BOTTLES DRAWN AEROBIC AND ANAEROBIC 5 CC EA  Final   Culture   Final    NO GROWTH < 24 HOURS Performed at [facility]    Report Status PENDING  Incomplete  Culture, blood (routine x 2)     Status: None (Preliminary result)   Collection Time: 09/08/14  4:27 PM  Result Value Ref Range Status   Specimen Description BLOOD RIGHT HAND  Final   Special Requests BOTTLES DRAWN AEROBIC AND ANAEROBIC 5 CC EA  Final   Culture   Final    NO GROWTH < 24 HOURS Performed at [facility]    Report Status PENDING  Incomplete  Urine culture     Status: None (Preliminary result)   Collection Time: 09/08/14  5:39 PM  Result Value Ref Range Status   Specimen Description URINE, CLEAN CATCH  Final   Special Requests Normal  Final   Culture   Final    NO GROWTH < 24 HOURS Performed at [facility]    Report Status PENDING  Incomplete  MRSA PCR Screening     Status: None   Collection Time: 09/08/14  9:02 PM  Result Value Ref Range Status   MRSA by PCR NEGATIVE NEGATIVE Final    Comment:        The GeneXpert MRSA Assay (FDA approved for NASAL specimens only), is one component of a comprehensive MRSA colonization surveillance program. It is not intended to diagnose MRSA infection nor to guide or monitor treatment for MRSA infections.      Studies: US  Renal  09/10/2014   CLINICAL DATA:  Acute renal failure. History of metastatic lung cancer.  EXAM: RENAL / URINARY TRACT ULTRASOUND COMPLETE  COMPARISON:  None.  FINDINGS: Right Kidney:  Length: 12.1 cm. Diffusely increased parenchymal echotexture consistent with chronic medical renal disease. No hydronephrosis. Multiple simple appearing cysts. Largest measuring about 3.8 cm diameter.  Left Kidney:  Length: 11.9 cm. Diffusely increased parenchymal echotexture consistent with chronic medical renal disease. No hydronephrosis. Multiple simple appearing cysts. Largest measures about 2.6 cm maximal diameter.  Bladder:  Appears normal for degree of bladder distention.  IMPRESSION: Diffusely increased parenchymal echotexture bilaterally consistent with medical renal disease. No hydronephrosis. Multiple bilateral renal cysts.   Electronically Signed   By: Lucienne Capers M.D.   On: 09/10/2014 02:48   Nm Pulmonary Perf And Vent  09/09/2014   CLINICAL  DATA:  Shortness of breath. Leg pain. Lung mass. Evaluate for pulmonary embolism.  EXAM: NUCLEAR MEDICINE VENTILATION - PERFUSION LUNG SCAN  TECHNIQUE: Ventilation images were obtained in multiple projections using inhaled aerosol Tc-11mDTPA. Perfusion images were obtained in multiple projections after intravenous injection of Tc-964mAA.  RADIOPHARMACEUTICALS:  40 Technetium-9965mPA aerosol inhalation and 6 Technetium-97m27m IV  COMPARISON:  Chest CT - 08/31/2014 ; chest radiograph - 09/08/2014; 08/22/2014  FINDINGS: Chest radiograph performed 09/08/2014 demonstrates grossly unchanged enlarged cardiac silhouette and mediastinal contours with atherosclerotic plaque within the thoracic aorta. Stable positioning of left anterior chest wall single lead pacemaker with lead tip overlying expected location of the right ventricular apex. Dense left basilar consolidative opacities compatible with near collapse of the left lower lobe secondary to suspected endobronchial or  pulmonary malignancy.  Ventilation: There is clumping of inhaled radiotracer about the pulmonary hila with expected preferential ventilation of the right lung in comparison to the left. There is decreased ventilation of the left lower lobe compatible with the known left lower lobe pulmonary mass. Photopenic defect is seen at the location of the patient's left anterior chest wall pacemaker power pack. Ingested radiotracer is seen within the hypopharynx.  Perfusion: There is relative homogeneous distribution of injected radiotracer throughout the pulmonary parenchyma with expected matched area of non perfusion involving the left lower lobe at the location of the patient's known left lower lobe pulmonary mass. No discrete mismatched areas of perfusion within the pulmonary parenchyma. Expected focal attenuation is seen at the patient's left anterior chest wall pacemaker power pack site.  IMPRESSION: Triple matched defect involving the left lower lobe at the location of the patient's known left lower lobe endobronchial/perihilar mass. Given the presence of a triple matched defect, technically this CT scan could be characterized as intermediate probability, though would favor this is an expected VQ scan given findings on recently obtained chest CT obtained 08/31/2014 and give lack of any additional mismatched perfusion defects would favor this represents a low probability VQ scan. Clinical correlation is advised. Further evaluation could be performed with lower extremity venous Doppler ultrasound as clinically indicated.  Critical Value/emergent results were called by telephone at the time of interpretation on 09/09/2014 at 11:06 am to Dr. FeinTitus Mouldo verbally acknowledged these results.   Electronically Signed   By: JohnSandi Mariscal.   On: 09/09/2014 11:13   Dg Chest Port 1 View  09/10/2014   CLINICAL DATA:  Lung mass. Hypertension. Asthma. Diabetes. Ex-smoker for 40 years.  EXAM: PORTABLE CHEST - 1 VIEW  COMPARISON:   09/08/2014  FINDINGS: Single lead pacer. Right hemidiaphragm elevation. Midline trachea. Moderate cardiomegaly with transverse aortic atherosclerosis. Suspect a small layering left pleural effusion. No pneumothorax. Low lung volumes with resultant pulmonary interstitial prominence. Mild concurrent venous congestion is suspected. Dense opacity in the retrocardiac left lower lobe, corresponding to masslike consolidation on the prior CT.  IMPRESSION: Redemonstration of left lower lobe opacity, better delineated on 08/31/2014 CT.  Diminished lung volumes.  Suspect mild pulmonary venous congestion.  Small layering left pleural effusion.   Electronically Signed   By: KyleAbigail Miyamoto.   On: 09/10/2014 08:40    Scheduled Meds: . carvedilol  12.5 mg Oral BID WC  . guaiFENesin  600 mg Oral BID  . hydrocortisone sodium succinate  50 mg Intravenous Q6H  . insulin aspart  0-5 Units Subcutaneous QHS  . insulin aspart  0-9 Units Subcutaneous TID WC  . ipratropium-albuterol  3 mL Nebulization TID  .  levofloxacin (LEVAQUIN) IV  750 mg Intravenous Q48H  . polyethylene glycol  17 g Oral Daily    Continuous Infusions: . sodium chloride 250 mL (09/10/14 0955)  . dextrose 5 % and 0.9% NaCl 50 mL/hr at 09/10/14 0812  . heparin 1,400 Units/hr (09/10/14 0500)     Time spent: 25 minutes  [facility] Pager [phone]. If 7PM-7AM, please contact night-coverage at [email or web address], password [facility] 09/10/2014, 10:32 AM  LOS: 2 days

## 2014-09-10 NOTE — Progress Notes (Signed)
[place] 1224 TRANSFERRED TO 70 IN W/CHAIR ON 2L [place] WITH NT. REPORT WAS CALLED TO [facility] RN PAM. NO APPARENT CHANGE IN PATIENT'S CONDITION.

## 2014-09-10 NOTE — Consult Note (Signed)
Consultation Note Date: 09/10/2014   Patient Name: Bradley Rasmussen  DOB: 01/27/1931  MRN: [id number]  Age / Sex: 81 y.o., male   PCP: Marco Collie, MD Referring Physician: Annita Brod, MD  Reason for Consultation: Establishing goals of care  Palliative Care Assessment and Plan Summary of Established Goals of Care and Medical Treatment Preferences  Patient is a 81 year old gentleman with a past medical history significant for diabetes hypertension asthma and history of smoking. Patient was recently found to have a lung mass and liver metastases. Patient has been seen and evaluated by oncology. Patient has also been seen by pulmonary critical care medicine. At this time, working diagnosis is metastatic lung cancer as his liver biopsy shows metastatic carcinoma and he has left lower lobe consolidation secondary to bronchial obstruction. Patient also has bony metastases and significant hilar or mediastinal adenopathy. He has a pacemaker. It is noted from oncology notes that it has been discussed with the patient and family that his cancer is incurable at this stage, this is a new diagnosis of cancer for this patient. It is deemed to be aggressive in nature. Palliative radiation has been recommended for symptom management to improve his dyspnea.  A palliative consultation has been placed for further goals of care discussions. Patient has been transferred out of the ICU setting. Patient is awake alert his wife son and cousin are present at the bedside. Patient endorses hope states that he is looking forward to his palliative radiation treatments. He even hopes qualify for some systemic treatment options such as chemotherapy or targeted therapy after he is done with his radiations. He doesn't recall meeting Dr. Burr Medico from oncology.  Briefly introduced scope of palliative services. CODE STATUS discussions undertaken. At this time there is no change in CODE STATUS. Family wishes to pursue full code and  palliative radiation in hopes of the patient qualifying for systemic chemotherapy soon. Continue to endorse maintaining hope as well as having realistic discussions. Palliative will follow along.  Thank you for the consult.   Contacts/Participants in Discussion: Primary Decision Maker:  Patient and his wife   HCPOA: yes  Wife  Code Status/Advance Care Planning:  Full code  Symptom Management:   No acute symptoms currently, continue to monitor  Palliative Prophylaxis: Yes  Additional Recommendations (Limitations, Scope, Preferences):  Discussed with patient and his family. They state that patient is looking forward to his radiation. They hope that he would also qualify for some kind of chemotherapy afterwards. Psycho-social/Spiritual:   Support System: Strong, wife and children  Desire for further Chaplaincy support:no  Prognosis: Probably less than 6 months  Discharge Planning:  To be determined based on clinical course and disease trajectory   Values: To continue life maintaining/life-prolonging treatment. To undergo palliative radiation. To continue with full code. Life limiting illness: Metastatic lung cancer      Chief Complaint/History of Present Illness: Weakness weight loss shortness of breath  Primary Diagnoses  Present on Admission:  . Abdominal pain . Lung mass . OSA on CPAP . Shortness of breath . Acute renal failure . Liver mass . Hyperkalemia . Hypokalemia . Lactic acidosis . Systolic CHF, chronic . Metastatic carcinoma involving liver with unknown primary site  Palliative Review of Systems: Completed I have reviewed the medical record, interviewed the patient and family, and examined the patient. The following aspects are pertinent.  Past Medical History  Diagnosis Date  . High blood pressure   . Irregular heart rhythm   . Asthma   .  Diabetes mellitus   . High cholesterol   . Kidney disease   . Sleep apnea   . Sickle cell trait   .  A-fib   . Complete heart block   . Pacemaker    Social History   Social History  . Marital Status: Married    Spouse Name: N/A  . Number of Children: N/A  . Years of Education: N/A   Social History Main Topics  . Smoking status: Former Smoker -- 0.50 packs/day for 40 years    Types: Cigarettes    Quit date: 01/14/1988  . Smokeless tobacco: Never Used  . Alcohol Use: No  . Drug Use: No  . Sexual Activity: Not Asked   Other Topics Concern  . None   Social History Narrative   Family History  Problem Relation Age of Onset  . Heart disease Father   . Heart disease Brother    Scheduled Meds: . carvedilol  12.5 mg Oral BID WC  . guaiFENesin  600 mg Oral BID  . hydrocortisone sodium succinate  50 mg Intravenous Q6H  . insulin aspart  0-5 Units Subcutaneous QHS  . insulin aspart  0-9 Units Subcutaneous TID WC  . ipratropium-albuterol  3 mL Nebulization TID  . levofloxacin (LEVAQUIN) IV  750 mg Intravenous Q48H  . polyethylene glycol  17 g Oral Daily   Continuous Infusions: . sodium chloride 10 mL/hr at 09/10/14 1037  . sodium chloride 1,000 mL (09/10/14 1204)  . heparin 1,300 Units/hr (09/10/14 1547)   PRN Meds:.albuterol, chlorpheniramine-HYDROcodone, docusate sodium, oxyCODONE, technetium albumin aggregated, technetium TC 86M diethylenetriame-pentaacetic acid Medications Prior to Admission:  Prior to Admission medications   Medication Sig Start Date End Date Taking? Authorizing Provider  allopurinol (ZYLOPRIM) 100 MG tablet Take 50 mg by mouth daily.    Yes Historical Provider, MD  apixaban (ELIQUIS) 2.5 MG TABS tablet Take 2.5 mg by mouth 2 (two) times daily.   Yes Historical Provider, MD  carvedilol (COREG) 12.5 MG tablet Take 12.5 mg by mouth 2 (two) times daily with a meal.   Yes Historical Provider, MD  Cholecalciferol (D-3-5) 5000 UNITS capsule Take 5,000 Units by mouth daily.   Yes Historical Provider, MD  docusate sodium (COLACE) 100 MG capsule Take 100 mg by  mouth every 6 (six) hours as needed for mild constipation.   Yes Historical Provider, MD  doxazosin (CARDURA) 8 MG tablet Take 8 mg by mouth daily.   Yes Historical Provider, MD  finasteride (PROSCAR) 5 MG tablet Take 5 mg by mouth daily.   Yes Historical Provider, MD  furosemide (LASIX) 40 MG tablet Take 20 mg by mouth 2 (two) times daily.    Yes Historical Provider, MD  glipiZIDE (GLUCOTROL) 5 MG tablet Take by mouth daily before breakfast.   Yes Historical Provider, MD  metolazone (ZAROXOLYN) 5 MG tablet Take 2.5 mg by mouth daily. 07/27/14  Yes Historical Provider, MD  oxyCODONE-acetaminophen (PERCOCET/ROXICET) 5-325 MG per tablet Take 1 tablet by mouth every 6 (six) hours as needed for severe pain. 09/05/14  Yes [name], MD  polyethylene glycol (MIRALAX / GLYCOLAX) packet Take 17 g by mouth daily.   Yes Historical Provider, MD  potassium chloride SA (K-DUR,KLOR-CON) 20 MEQ tablet Take 20 mEq by mouth 3 (three) times daily.    Yes Historical Provider, MD  pravastatin (PRAVACHOL) 80 MG tablet Take 40 mg by mouth every evening.    Yes Historical Provider, MD  Respiratory Therapy Supplies (FLUTTER) DEVI Use as directed  09/12/13  Yes [name], MD  oxyCODONE (OXY IR/ROXICODONE) 5 MG immediate release tablet Take 1 tablet (5 mg total) by mouth every 6 (six) hours as needed (pain). Patient not taking: Reported on 09/08/2014 08/29/14   [name], MD   Allergies  Allergen Reactions  . Amoxicillin Itching and Rash   CBC:    Component Value Date/Time   WBC 19.7* 09/10/2014 0524   HGB 10.8* 09/10/2014 0524   HCT 33.3* 09/10/2014 0524   PLT 230 09/10/2014 0524   MCV 74.3* 09/10/2014 0524   NEUTROABS 12.0* 09/08/2014 1604   LYMPHSABS 0.6* 09/08/2014 1604   MONOABS 1.2* 09/08/2014 1604   EOSABS 0.2 09/08/2014 1604   BASOSABS 0.0 09/08/2014 1604   Comprehensive Metabolic Panel:    Component Value Date/Time   NA 137 09/10/2014 0524   K 4.0 09/10/2014 0524   CL 97*  09/10/2014 0524   CO2 13* 09/10/2014 0524   BUN 75* 09/10/2014 0524   CREATININE 2.31* 09/10/2014 0524   GLUCOSE 128* 09/10/2014 0524   CALCIUM 9.4 09/10/2014 0524   AST 110* 09/09/2014 0400   ALT <5* 09/09/2014 0400   ALKPHOS 474* 09/09/2014 0400   BILITOT 1.3* 09/09/2014 0400   PROT 6.2* 09/09/2014 0400   ALBUMIN 2.4* 09/09/2014 0400    Physical Exam: Vital Signs: BP 125/63 mmHg  Pulse 75  Temp(Src) 99.4 F (37.4 C) (Oral)  Resp 20  Ht '5\' 9"'$  (1.753 m)  Wt 96.6 kg (212 lb 15.4 oz)  BMI 31.43 kg/m2  SpO2 99% SpO2: SpO2: 99 % O2 Device: O2 Device: Nasal Cannula O2 Flow Rate: O2 Flow Rate (L/min): 2 L/min Intake/output summary:  Intake/Output Summary (Last 24 hours) at 09/10/14 1630 Last data filed at 09/10/14 1300  Gross per 24 hour  Intake 3500.17 ml  Output    975 ml  Net 2525.17 ml   LBM: Last BM Date: 09/08/14 Baseline Weight: Weight: 91.1 kg (200 lb 13.4 oz) Most recent weight: Weight: 96.6 kg (212 lb 15.4 oz)  Exam Findings:   Age-appropriate appearing gentleman resting in bed Awake alert oriented Diminished breath sounds bilaterally Abdomen soft nontender S1-S2 No edema                       Palliative Performance Scale: 40%  Additional Data Reviewed: Recent Labs     09/08/14  1604   09/09/14  2022  09/10/14  0524  WBC  14.0*   --    --   19.7*  HGB  11.8*   --    --   10.8*  PLT  184   --    --   230  NA  135   < >  137  137  BUN  79*   < >  74*  75*  CREATININE  2.14*   < >  2.33*  2.31*   < > = values in this interval not displayed.     Time In: 1530 Time Out: 1630 Time Total: 55 min  Greater than 50%  of this time was spent counseling and coordinating care related to the above assessment and plan.  Signed by: Loistine Chance, MD [id number] Loistine Chance, MD  09/10/2014, 4:30 PM  Please contact Palliative Medicine Team phone at [phone] for questions and concerns.

## 2014-09-10 NOTE — Progress Notes (Signed)
Radiation Oncology         [phone] ________________________________  Initial Inpatient Consultation  Name: Bradley Rasmussen MRN: [id number]  Date: 09/10/2014  DOB: [birth year]  VQ:[id number],PPJK, MD  No ref. provider found   REFERRING PHYSICIAN: Truitt Merle, MD  DIAGNOSIS: Poorly differentiated carcinoma, likely stage IV lung cancer  HISTORY OF PRESENT ILLNESS::Bradley Rasmussen is a 81 y.o. male who is seen out of the courtesy of Dr Burr Medico for an opinion concerning radiation therapy as part of management of patient's recently diagnosed advanced poorly differentiated carcinoma. Patient was recently admitted to the hospital with worsening dyspnea. He recently underwent biopsy of his liver which revealed poorly differentiated carcinoma. Scanning shows mediastinal and hilar adenopathy as well as extensive liver metastasis osseous metastasis and upper abdominal lymphadenopathy. In light of the patient's breathing problems see was temporarily admitted to the intensive care unit but is now doing better and his been transferred to telemetry.  Concern is the patient has obstruction of his left lower lobe bronchus from an endobronchial lesion or external compression which is resulted in collapse of his left lower lobe. Radiation oncology has been consulted for consideration for palliative treatment.  PREVIOUS RADIATION THERAPY: No  PAST MEDICAL HISTORY:  has a past medical history of High blood pressure; Irregular heart rhythm; Asthma; Diabetes mellitus; High cholesterol; Kidney disease; Sleep apnea; Sickle cell trait; A-fib; Complete heart block; and Pacemaker.    PAST SURGICAL HISTORY: Past Surgical History  Procedure Laterality Date  . Left lens implant  07/1994  . Left rotator cuff  03/1995  . Pacemaker insertion  03/1997  . Penile prosthesis implant  08/1997  . Right lens implant  06/1999  . Pacemaker replacement  11/2006  . Circumcision  03/2008    FAMILY HISTORY: family history includes Heart disease in  his brother and father.  SOCIAL HISTORY:  reports that he quit smoking about 26 years ago. His smoking use included Cigarettes. He has a 20 pack-year smoking history. He has never used smokeless tobacco. He reports that he does not drink alcohol or use illicit drugs.  ALLERGIES: Amoxicillin  MEDICATIONS:  No current facility-administered medications for this visit.   No current outpatient prescriptions on file.   Facility-Administered Medications Ordered in Other Visits  Medication Dose Route Frequency Provider Last Rate Last Dose  . 0.9 %  sodium chloride infusion   Intravenous Continuous Annita Brod, MD 10 mL/hr at 09/10/14 1037    . 0.9 %  sodium chloride infusion   Intravenous Continuous Annita Brod, MD 75 mL/hr at 09/10/14 1204 1,000 mL at 09/10/14 1204  . albuterol (PROVENTIL) (2.5 MG/3ML) 0.083% nebulizer solution 2.5 mg  2.5 mg Nebulization Q4H PRN Toy Baker, MD      . carvedilol (COREG) tablet 12.5 mg  12.5 mg Oral BID WC Toy Baker, MD   12.5 mg at 09/10/14 1744  . chlorpheniramine-HYDROcodone (TUSSIONEX) 10-8 MG/5ML suspension 5 mL  5 mL Oral Q12H PRN Ritta Slot, NP   5 mL at 09/09/14 0015  . docusate sodium (COLACE) capsule 100 mg  100 mg Oral Q6H PRN Toy Baker, MD      . guaiFENesin (MUCINEX) 12 hr tablet 600 mg  600 mg Oral BID Toy Baker, MD   600 mg at 09/10/14 0929  . heparin ADULT infusion 100 units/mL (25000 units/250 mL)  1,300 Units/hr Intravenous Continuous Annita Brod, MD 13 mL/hr at 09/10/14 1547 1,300 Units/hr at 09/10/14 1547  . hydrocortisone sodium succinate (SOLU-CORTEF) 100 MG  injection 50 mg  50 mg Intravenous Q6H Raylene Miyamoto, MD   50 mg at 09/10/14 1744  . insulin aspart (novoLOG) injection 0-5 Units  0-5 Units Subcutaneous QHS Annita Brod, MD      . insulin aspart (novoLOG) injection 0-9 Units  0-9 Units Subcutaneous TID WC Annita Brod, MD   1 Units at 09/10/14 1745  .  ipratropium-albuterol (DUONEB) 0.5-2.5 (3) MG/3ML nebulizer solution 3 mL  3 mL Nebulization TID Toy Baker, MD   3 mL at 09/10/14 1454  . levofloxacin (LEVAQUIN) IVPB 750 mg  750 mg Intravenous Q48H Emiliano Dyer, RPH   750 mg at 09/09/14 1123  . oxyCODONE (Oxy IR/ROXICODONE) immediate release tablet 5 mg  5 mg Oral Q6H PRN Toy Baker, MD   5 mg at 09/10/14 0816  . polyethylene glycol (MIRALAX / GLYCOLAX) packet 17 g  17 g Oral Daily Toy Baker, MD   17 g at 09/10/14 0929  . technetium albumin aggregated (MAA) injection solution 6 milli Curie  6 milli Curie Intravenous Once PRN Medication Radiologist, MD      . technetium TC 59M diethylenetriame-pentaacetic acid (DTPA) injection 40 milli Curie  40 milli Curie Intravenous Once PRN Medication Radiologist, MD        REVIEW OF SYSTEMS:  A 15 point review of systems is documented in the electronic medical record. This was obtained by the nursing staff. However, I reviewed this with the patient to discuss relevant findings and make appropriate changes.  Over the past couple of months patient has noticed problems with coughing and congestion. He denies any hemoptysis. He denies any pain within the chest area or other areas at this time. The patient denies any headaches. He denies any further problems with double vision   PHYSICAL EXAM:  Vitals - 1 value per visit 2/59/5638  SYSTOLIC 756  DIASTOLIC 63  Pulse 75  Temperature 99.4  Respirations 20  Weight (lb) 212.96  Height   BMI   VISIT REPORT    This is a very pleasant 81 year old gentleman lying in his hospital bed. He is accompanied by several family members and friends this evening. Patient is alert and responds appropriately to questions. He has supplemental oxygen in place by nasal cannula.  The pupils are equal round and react to light. The extraocular eye movements are intact.  the tongue is midline. No palpable supraclavicular or axillary adenopathy. Some swelling  noted in the upper sternum possibly related to his osseous metastasis.  Examination of the lungs reveals decreased breath sounds in the left lower lung field. The heart has a regular rhythm and rate. The pacemaker is palpable in the left upper chest area. The abdomen is somewhat distended with normal bowel sounds. No rebound or guarding present. On neurological examination motor strength is 5 out of 5 in the proximal and distal muscle groups of the upper lower extremities.    ECOG = 3  LABORATORY DATA:  Lab Results  Component Value Date   WBC 19.7* 09/10/2014   HGB 10.8* 09/10/2014   HCT 33.3* 09/10/2014   MCV 74.3* 09/10/2014   PLT 230 09/10/2014   NEUTROABS 12.0* 09/08/2014   Lab Results  Component Value Date   NA 137 09/10/2014   K 4.0 09/10/2014   CL 97* 09/10/2014   CO2 13* 09/10/2014   GLUCOSE 128* 09/10/2014   CREATININE 2.31* 09/10/2014   CALCIUM 9.4 09/10/2014      RADIOGRAPHY: Ct Abdomen Pelvis Wo Contrast  08/31/2014  CLINICAL DATA:  81 year old male with abdominal pain for the past month. Weight loss for the past several weeks.  EXAM: CT CHEST, ABDOMEN AND PELVIS WITHOUT CONTRAST  TECHNIQUE: Multidetector CT imaging of the chest, abdomen and pelvis was performed following the standard protocol without IV contrast.  COMPARISON:  Chest CT 07/25/2014.  FINDINGS: CT CHEST FINDINGS  Mediastinum/Lymph Nodes: Heart size is enlarged with biatrial dilatation. There is no significant pericardial fluid, thickening or pericardial calcification. There is atherosclerosis of the thoracic aorta, the great vessels of the mediastinum and the coronary arteries, including calcified atherosclerotic plaque in the left main, left anterior descending, left circumflex and right coronary arteries. Mild calcifications of the aortic valve. Left-sided pacemaker device in position with lead tip terminating in the right ventricular apex. Multiple borderline enlarged and enlarged mediastinal lymph nodes are  noted measuring up to 19 mm in short axis in the subcarinal nodal station and 18 mm in short axis in the right paratracheal nodal station. Fullness of soft tissues in the left hilar region suggesting underlying lymphadenopathy, poorly evaluated on today's noncontrast chest CT. Esophagus is unremarkable in appearance. No axillary lymphadenopathy.  Lungs/Pleura: Previously demonstrated atelectasis in the left lower lobe has progressed, now with complete collapse/ consolidation of the left lower lobe. There is abrupt cut off of the left lower lobe bronchus on image 27 of series 4, likely related to either extrinsic obstruction or endobronchial lesion. No definite pleural effusions. Patchy multifocal peribronchovascular ground-glass attenuation in the right upper and middle lobes is nonspecific, but favored to be infectious or inflammatory in etiology, with the largest ground-glass attenuation area measuring 11 x 8 mm in the lateral segment of the right middle lobe (image 22 of series 4). These are more apparent than the prior study.  Musculoskeletal/Soft Tissues: 1.7 cm lytic lesion in the left side of the manubrium (image 13 of series 4). Expansile lesion with moth-eaten appearance in the lateral aspect of the left seventh rib, with possible nondisplaced pathologic fracture. Multiple other smaller lucent lesions in the visualized axial and appendicular skeleton, concerning for multifocal skeletal metastasis.  CT ABDOMEN AND PELVIS FINDINGS  Hepatobiliary: The liver is enlarged (22.7 cm craniocaudal span) and very heterogeneous in appearance with what appear to be multiple hepatic nodules and masses (poorly evaluated on today's noncontrast CT examination), highly concerning for widespread hepatic metastases. Gallbladder is unremarkable in appearance.  Pancreas: No definite pancreatic mass or peripancreatic inflammatory changes on today's noncontrast CT examination.  Spleen: Unremarkable.  Adrenals/Urinary Tract:  Multiple renal lesions bilaterally are incompletely characterized on today's noncontrast CT examination. These appear similar to the prior study, and the majority of these are low-attenuation, including the largest exophytic lesion in the posterior aspect of the interpolar region of the right kidney which measures 3.7 cm, likely to represent cysts; although several of these lesions are high attenuation, most likely to represent proteinaceous or hemorrhagic cysts. No hydroureteronephrosis in the visualized portions of the abdomen. The unenhanced appearance of the urinary bladder is normal.  Stomach/Bowel: The unenhanced appearance of the stomach is unremarkable. No pathologic dilatation of small bowel or colon. Normal appendix.  Vascular/Lymphatic: Extensive atherosclerosis throughout the abdominal and pelvic vasculature, without evidence of aneurysm. Extensive lymphadenopathy is noted throughout the visualized abdomen pelvis, including portacaval lymph node measuring up to 3.1 cm in short axis, numerous retroperitoneal lymph nodes measuring up to 1.4 cm in short axis, and pelvic lymph nodes measuring up to 1.2 cm in short axis in the right common iliac nodal  position.  Reproductive: Prostate gland is enlarged with median lobe hypertrophy. Seminal vesicles are unremarkable. Penile prosthesis with reservoir in the lower right anterior abdominal wall.  Other: No significant volume of ascites.  No pneumoperitoneum.  Musculoskeletal: Numerous ill-defined lucent lesions in the visualized axial skeleton, concerning for multifocal metastases, most notably in the left side of the L3 vertebral body where there is a 12 mm lucent lesion (image 75 of series 2). 2.2 x 4.2 cm high attenuation (57 HU) lesion in the subcutaneous fat overlying the right buttocks (image 91 of series 2) may represent a hematoma, or potential soft tissue metastasis.  IMPRESSION: 1. Persistent and worsening atelectasis/consolidation in the left lower  lobe, presumably secondary to either extrinsic or endobronchial obstruction of the left lower lobe bronchus, which likely is secondary to primary bronchogenic neoplasm. This is associated with worsening left hilar and bilateral mediastinal lymphadenopathy, worsening multifocal hepatic lesions which likely represent multifocal hepatic metastases, widespread skeletal metastases, and lymphadenopathy in the abdomen pelvis which is presumably metastatic. 2. Multiple renal lesions incompletely characterized on today's noncontrast CT examination. Statistically, these are likely to represent a combination of cysts and proteinaceous/hemorrhagic cysts. 3. Atherosclerosis, including left main and 3 vessel coronary artery disease. 4. Cardiomegaly with biatrial dilatation. 5. Additional incidental findings, as above. These results will be called to the ordering clinician or representative by the Radiologist Assistant, and communication documented in the PACS or zVision Dashboard.   Electronically Signed   By: Vinnie Langton M.D.   On: 08/31/2014 13:49   Dg Chest 2 View  09/08/2014   CLINICAL DATA:  Shortness of breath for about a month. Abdominal pain. Diagnosed with a "Lung mass about a week ago" .  EXAM: CHEST  2 VIEW  COMPARISON:  Plain films 08/22/2014.  CT of 08/31/2014  FINDINGS: Mild right hemidiaphragm elevation. Single lead pacer tip at right ventricle. Patient rotated minimally left. Midline trachea. Mild cardiomegaly. Atherosclerosis in the transverse aorta. No pleural effusion or pneumothorax. Dense left lower lobe opacity again identified, similar. Cutoff of the left lower lobe bronchus.  IMPRESSION: No change since 08/22/2014.  Left lower lobe opacity, better evaluated on 08/31/2014 CT.  Cardiomegaly and atherosclerosis.   Electronically Signed   By: Abigail Miyamoto M.D.   On: 09/08/2014 17:20   Ct Head Wo Contrast  09/08/2014   CLINICAL DATA:  Shortness of breath for 1 month. Double vision. A biopsy has shown  that the patient has cancer but the patient has not been told what type.  EXAM: CT HEAD WITHOUT CONTRAST  TECHNIQUE: Contiguous axial images were obtained from the base of the skull through the vertex without intravenous contrast.  COMPARISON:  08/22/2014  FINDINGS: Ventricles and sulci appear symmetrical. No mass effect or midline shift. No abnormal extra-axial fluid collections. Gray-white matter junctions are distinct. Basal cisterns are not effaced. No evidence of acute intracranial hemorrhage. No depressed skull fractures. Retention cysts in the sphenoid sinus. Mastoid air cells are not opacified.  IMPRESSION: No acute intracranial abnormalities.   Electronically Signed   By: Lucienne Capers M.D.   On: 09/08/2014 20:29   Ct Chest Wo Contrast  08/31/2014   CLINICAL DATA:  81 year old male with abdominal pain for the past month. Weight loss for the past several weeks.  EXAM: CT CHEST, ABDOMEN AND PELVIS WITHOUT CONTRAST  TECHNIQUE: Multidetector CT imaging of the chest, abdomen and pelvis was performed following the standard protocol without IV contrast.  COMPARISON:  Chest CT 07/25/2014.  FINDINGS: CT CHEST  FINDINGS  Mediastinum/Lymph Nodes: Heart size is enlarged with biatrial dilatation. There is no significant pericardial fluid, thickening or pericardial calcification. There is atherosclerosis of the thoracic aorta, the great vessels of the mediastinum and the coronary arteries, including calcified atherosclerotic plaque in the left main, left anterior descending, left circumflex and right coronary arteries. Mild calcifications of the aortic valve. Left-sided pacemaker device in position with lead tip terminating in the right ventricular apex. Multiple borderline enlarged and enlarged mediastinal lymph nodes are noted measuring up to 19 mm in short axis in the subcarinal nodal station and 18 mm in short axis in the right paratracheal nodal station. Fullness of soft tissues in the left hilar region  suggesting underlying lymphadenopathy, poorly evaluated on today's noncontrast chest CT. Esophagus is unremarkable in appearance. No axillary lymphadenopathy.  Lungs/Pleura: Previously demonstrated atelectasis in the left lower lobe has progressed, now with complete collapse/ consolidation of the left lower lobe. There is abrupt cut off of the left lower lobe bronchus on image 27 of series 4, likely related to either extrinsic obstruction or endobronchial lesion. No definite pleural effusions. Patchy multifocal peribronchovascular ground-glass attenuation in the right upper and middle lobes is nonspecific, but favored to be infectious or inflammatory in etiology, with the largest ground-glass attenuation area measuring 11 x 8 mm in the lateral segment of the right middle lobe (image 22 of series 4). These are more apparent than the prior study.  Musculoskeletal/Soft Tissues: 1.7 cm lytic lesion in the left side of the manubrium (image 13 of series 4). Expansile lesion with moth-eaten appearance in the lateral aspect of the left seventh rib, with possible nondisplaced pathologic fracture. Multiple other smaller lucent lesions in the visualized axial and appendicular skeleton, concerning for multifocal skeletal metastasis.  CT ABDOMEN AND PELVIS FINDINGS  Hepatobiliary: The liver is enlarged (22.7 cm craniocaudal span) and very heterogeneous in appearance with what appear to be multiple hepatic nodules and masses (poorly evaluated on today's noncontrast CT examination), highly concerning for widespread hepatic metastases. Gallbladder is unremarkable in appearance.  Pancreas: No definite pancreatic mass or peripancreatic inflammatory changes on today's noncontrast CT examination.  Spleen: Unremarkable.  Adrenals/Urinary Tract: Multiple renal lesions bilaterally are incompletely characterized on today's noncontrast CT examination. These appear similar to the prior study, and the majority of these are low-attenuation,  including the largest exophytic lesion in the posterior aspect of the interpolar region of the right kidney which measures 3.7 cm, likely to represent cysts; although several of these lesions are high attenuation, most likely to represent proteinaceous or hemorrhagic cysts. No hydroureteronephrosis in the visualized portions of the abdomen. The unenhanced appearance of the urinary bladder is normal.  Stomach/Bowel: The unenhanced appearance of the stomach is unremarkable. No pathologic dilatation of small bowel or colon. Normal appendix.  Vascular/Lymphatic: Extensive atherosclerosis throughout the abdominal and pelvic vasculature, without evidence of aneurysm. Extensive lymphadenopathy is noted throughout the visualized abdomen pelvis, including portacaval lymph node measuring up to 3.1 cm in short axis, numerous retroperitoneal lymph nodes measuring up to 1.4 cm in short axis, and pelvic lymph nodes measuring up to 1.2 cm in short axis in the right common iliac nodal position.  Reproductive: Prostate gland is enlarged with median lobe hypertrophy. Seminal vesicles are unremarkable. Penile prosthesis with reservoir in the lower right anterior abdominal wall.  Other: No significant volume of ascites.  No pneumoperitoneum.  Musculoskeletal: Numerous ill-defined lucent lesions in the visualized axial skeleton, concerning for multifocal metastases, most notably in the left side of  the L3 vertebral body where there is a 12 mm lucent lesion (image 75 of series 2). 2.2 x 4.2 cm high attenuation (57 HU) lesion in the subcutaneous fat overlying the right buttocks (image 91 of series 2) may represent a hematoma, or potential soft tissue metastasis.  IMPRESSION: 1. Persistent and worsening atelectasis/consolidation in the left lower lobe, presumably secondary to either extrinsic or endobronchial obstruction of the left lower lobe bronchus, which likely is secondary to primary bronchogenic neoplasm. This is associated with  worsening left hilar and bilateral mediastinal lymphadenopathy, worsening multifocal hepatic lesions which likely represent multifocal hepatic metastases, widespread skeletal metastases, and lymphadenopathy in the abdomen pelvis which is presumably metastatic. 2. Multiple renal lesions incompletely characterized on today's noncontrast CT examination. Statistically, these are likely to represent a combination of cysts and proteinaceous/hemorrhagic cysts. 3. Atherosclerosis, including left main and 3 vessel coronary artery disease. 4. Cardiomegaly with biatrial dilatation. 5. Additional incidental findings, as above. These results will be called to the ordering clinician or representative by the Radiologist Assistant, and communication documented in the PACS or zVision Dashboard.   Electronically Signed   By: Vinnie Langton M.D.   On: 08/31/2014 13:49   US Renal  09/10/2014   CLINICAL DATA:  Acute renal failure. History of metastatic lung cancer.  EXAM: RENAL / URINARY TRACT ULTRASOUND COMPLETE  COMPARISON:  None.  FINDINGS: Right Kidney:  Length: 12.1 cm. Diffusely increased parenchymal echotexture consistent with chronic medical renal disease. No hydronephrosis. Multiple simple appearing cysts. Largest measuring about 3.8 cm diameter.  Left Kidney:  Length: 11.9 cm. Diffusely increased parenchymal echotexture consistent with chronic medical renal disease. No hydronephrosis. Multiple simple appearing cysts. Largest measures about 2.6 cm maximal diameter.  Bladder:  Appears normal for degree of bladder distention.  IMPRESSION: Diffusely increased parenchymal echotexture bilaterally consistent with medical renal disease. No hydronephrosis. Multiple bilateral renal cysts.   Electronically Signed   By: Lucienne Capers M.D.   On: 09/10/2014 02:48   Nm Pulmonary Perf And Vent  09/09/2014   CLINICAL DATA:  Shortness of breath. Leg pain. Lung mass. Evaluate for pulmonary embolism.  EXAM: NUCLEAR MEDICINE VENTILATION  - PERFUSION LUNG SCAN  TECHNIQUE: Ventilation images were obtained in multiple projections using inhaled aerosol Tc-25mDTPA. Perfusion images were obtained in multiple projections after intravenous injection of Tc-932mAA.  RADIOPHARMACEUTICALS:  40 Technetium-9955mPA aerosol inhalation and 6 Technetium-6m51m IV  COMPARISON:  Chest CT - 08/31/2014 ; chest radiograph - 09/08/2014; 08/22/2014  FINDINGS: Chest radiograph performed 09/08/2014 demonstrates grossly unchanged enlarged cardiac silhouette and mediastinal contours with atherosclerotic plaque within the thoracic aorta. Stable positioning of left anterior chest wall single lead pacemaker with lead tip overlying expected location of the right ventricular apex. Dense left basilar consolidative opacities compatible with near collapse of the left lower lobe secondary to suspected endobronchial or pulmonary malignancy.  Ventilation: There is clumping of inhaled radiotracer about the pulmonary hila with expected preferential ventilation of the right lung in comparison to the left. There is decreased ventilation of the left lower lobe compatible with the known left lower lobe pulmonary mass. Photopenic defect is seen at the location of the patient's left anterior chest wall pacemaker power pack. Ingested radiotracer is seen within the hypopharynx.  Perfusion: There is relative homogeneous distribution of injected radiotracer throughout the pulmonary parenchyma with expected matched area of non perfusion involving the left lower lobe at the location of the patient's known left lower lobe pulmonary mass. No discrete mismatched areas of  perfusion within the pulmonary parenchyma. Expected focal attenuation is seen at the patient's left anterior chest wall pacemaker power pack site.  IMPRESSION: Triple matched defect involving the left lower lobe at the location of the patient's known left lower lobe endobronchial/perihilar mass. Given the presence of a triple matched  defect, technically this CT scan could be characterized as intermediate probability, though would favor this is an expected VQ scan given findings on recently obtained chest CT obtained 08/31/2014 and give lack of any additional mismatched perfusion defects would favor this represents a low probability VQ scan. Clinical correlation is advised. Further evaluation could be performed with lower extremity venous Doppler ultrasound as clinically indicated.  Critical Value/emergent results were called by telephone at the time of interpretation on 09/09/2014 at 11:06 am to Dr. Titus Mould, who verbally acknowledged these results.   Electronically Signed   By: Sandi Mariscal M.D.   On: 09/09/2014 11:13   US Biopsy  09/05/2014   CLINICAL DATA:  Innumerable hepatic metastases, suspect lung primary with an obscured left lower lobe hilar mass resulting in complete left lower lobe collapse.  EXAM: ULTRASOUND GUIDED CORE BIOPSY OF LEFT HEPATIC MASS  MEDICATIONS: 0.5 mg IV Versed; 25 mcg IV Fentanyl  Total Moderate Sedation Time: 10 MINUTES  PROCEDURE: The procedure, risks, benefits, and alternatives were explained to the patient. Questions regarding the procedure were encouraged and answered. The patient understands and consents to the procedure.  The ANTERIOR EPIGASTRIC REGION was prepped with ChloraPrep in a sterile fashion, and a sterile drape was applied covering the operative field. A sterile gown and sterile gloves were used for the procedure. Local anesthesia was provided with 1% Lidocaine.  Previous imaging reviewed. Preliminary ultrasound performed. The liver is enlarged with numerous hypoechoic solid lesions throughout. A left hepatic mass in the lateral segment was targeted for biopsy. Under sterile conditions and local anesthesia, a 17 gauge 6.8 cm access needle was advanced percutaneously under direct ultrasound. Needle tract initially traversed normal liver tissue into a hypoechoic lesion. Needle position confirmed  with ultrasound. 3 18 gauge core biopsies obtained. Samples placed in formalin. Needle removed. No immediate complication. Patient tolerated the procedure well.  COMPLICATIONS: None.  FINDINGS: Imaging confirms needle placement in the left hepatic lesion for core biopsy  IMPRESSION: Successful ultrasound left hepatic mass 18 gauge core biopsy   Electronically Signed   By: Jerilynn Mages.  Shick M.D.   On: 09/05/2014 12:14   Dg Chest Port 1 View  09/10/2014   CLINICAL DATA:  Lung mass. Hypertension. Asthma. Diabetes. Ex-smoker for 40 years.  EXAM: PORTABLE CHEST - 1 VIEW  COMPARISON:  09/08/2014  FINDINGS: Single lead pacer. Right hemidiaphragm elevation. Midline trachea. Moderate cardiomegaly with transverse aortic atherosclerosis. Suspect a small layering left pleural effusion. No pneumothorax. Low lung volumes with resultant pulmonary interstitial prominence. Mild concurrent venous congestion is suspected. Dense opacity in the retrocardiac left lower lobe, corresponding to masslike consolidation on the prior CT.  IMPRESSION: Redemonstration of left lower lobe opacity, better delineated on 08/31/2014 CT.  Diminished lung volumes.  Suspect mild pulmonary venous congestion.  Small layering left pleural effusion.   Electronically Signed   By: Abigail Miyamoto M.D.   On: 09/10/2014 08:40      IMPRESSION: Probable stage IV poorly differentiated carcinoma of the lung. The patient likely has a endobronchial lesion or external compression in the left hilar area which is resulting collapse of his left lower lung. The patient would be a good candidate for a short course  of palliative radiation therapy directed at the left central chest area. I discussed the treatment course side effects and potential toxicities of radiation therapy in this situation with the patient and his family. He appears to understand and wishes to proceed with planned course of treatment.  PLAN: Simulation and planning on Monday, August 29. The patient will  likely proceed with his first treatment on August 30. He will require cardiac clearance in light of his pacemaker in the left upper chest. It does appear that we can avoid direct treatment of his pacemaker given that his lesion is in the left lower chest region.  Anticipate approximately 2 weeks of palliative radiation therapy. This will begin as an inpatient and then continue as an outpatient once he is ready for discharge.    ------------------------------------------------  Blair Promise, PhD, MD

## 2014-09-10 NOTE — Consult Note (Signed)
PULMONARY / CRITICAL CARE MEDICINE   Name: Bradley Rasmussen MRN: [id number] DOB: 1931/05/28    ADMISSION DATE:  09/08/2014 CONSULTATION DATE:  09/09/14  REFERRING MD :  Triad  CHIEF COMPLAINT:  Lactic acidosis, SOB  INITIAL PRESENTATION:  Recent Bx Liver with cancer, SOB, now lactic acidosis  STUDIES:  CT chest 8/18>>>Worsening LLL atx concerning for obstruction, mass, liver mets, Lymphadenopathy CT head 8/27>>>neg 8/27 VQ >>>low prob 8/28 renal US>>>no hydro, med dz, bilat cysts 8/28 doppler legs>>>neg (had unilateral leg swelling reported)  SIGNIFICANT EVENTS: 8/23- liver bx, left mass lung on CT chest on 18 th worsening atx 8/26- admitted worsening sob, lactic acidosis, change to heparin drip 8/28- no distress, on heparin  REVIEW OF SYSTEMS: ROS performed day 1  SUBJECTIVE: eating, no distress  VITAL SIGNS: Temp:  [97.5 F (36.4 C)-98.5 F (36.9 C)] 98 F (36.7 C) (08/28 0400) Pulse Rate:  [74-85] 84 (08/28 0600) Resp:  [15-24] 16 (08/28 0600) BP: (110-144)/(55-83) 130/77 mmHg (08/28 0600) SpO2:  [95 %-99 %] 99 % (08/28 0600) Weight:  [96.6 kg (212 lb 15.4 oz)] 96.6 kg (212 lb 15.4 oz) (08/28 0500) HEMODYNAMICS:   VENTILATOR SETTINGS:   INTAKE / OUTPUT:  Intake/Output Summary (Last 24 hours) at 09/10/14 0742 Last data filed at 09/10/14 0600  Gross per 24 hour  Intake 3693.67 ml  Output    675 ml  Net 3018.67 ml    PHYSICAL EXAMINATION: General:  No distress Neuro:  Nonfocal, alert , nonfocal HEENT:  jvd  wnl Cardiovascular: s1 s2 rrr paced Lungs:  Left base reduced slight ronchi/ reduced Abdomen:  Liver palpated, bs wnl, no r/g Musculoskeletal:  No rash, rt leg equal size left Skin:  No rash  LABS:  CBC  Recent Labs Lab 09/05/14 0852 09/08/14 1604 09/10/14 0524  WBC 13.2* 14.0* 19.7*  HGB 11.7* 11.8* 10.8*  HCT 35.1* 36.4* 33.3*  PLT 181 184 230   Coag's  Recent Labs Lab 09/05/14 0852 09/08/14 1604 09/09/14 0400 09/09/14 2022  09/10/14 0524  APTT 28  --  29 55* 80*  INR 1.16 1.18 1.30  --   --    BMET  Recent Labs Lab 09/09/14 1220 09/09/14 2022 09/10/14 0524  NA 136 137 137  K 4.6 4.8 4.0  CL 97* 96* 97*  CO2 17* 12* 13*  BUN 76* 74* 75*  CREATININE 2.17* 2.33* 2.31*  GLUCOSE 97 154* 128*   Electrolytes  Recent Labs Lab 09/09/14 1220 09/09/14 2022 09/10/14 0524  CALCIUM 9.8 9.5 9.4   Sepsis Markers  Recent Labs Lab 09/08/14 2200 09/09/14 0051 09/09/14 0400  LATICACIDVEN 10.7* 12.7* 13.5*  PROCALCITON 1.70  --   --    ABG No results for input(s): PHART, PCO2ART, PO2ART in the last 168 hours. Liver Enzymes  Recent Labs Lab 09/08/14 1604 09/09/14 0400  AST 115* 110*  ALT 47 <5*  ALKPHOS 490* 474*  BILITOT 1.0 1.3*  ALBUMIN 2.5* 2.4*   Cardiac Enzymes  Recent Labs Lab 09/08/14 2200 09/09/14 0400 09/09/14 1220  TROPONINI 0.04* 0.03 0.03   Glucose  Recent Labs Lab 09/09/14 0736 09/09/14 1153 09/09/14 1546 09/09/14 1937 09/09/14 2339 09/10/14 0345  GLUCAP 71 75 126* 148* 156* 140*    Imaging US Renal  09/10/2014   CLINICAL DATA:  Acute renal failure. History of metastatic lung cancer.  EXAM: RENAL / URINARY TRACT ULTRASOUND COMPLETE  COMPARISON:  None.  FINDINGS: Right Kidney:  Length: 12.1 cm. Diffusely increased parenchymal echotexture consistent with chronic  medical renal disease. No hydronephrosis. Multiple simple appearing cysts. Largest measuring about 3.8 cm diameter.  Left Kidney:  Length: 11.9 cm. Diffusely increased parenchymal echotexture consistent with chronic medical renal disease. No hydronephrosis. Multiple simple appearing cysts. Largest measures about 2.6 cm maximal diameter.  Bladder:  Appears normal for degree of bladder distention.  IMPRESSION: Diffusely increased parenchymal echotexture bilaterally consistent with medical renal disease. No hydronephrosis. Multiple bilateral renal cysts.   Electronically Signed   By: Lucienne Capers M.D.   On:  09/10/2014 02:48   Nm Pulmonary Perf And Vent  09/09/2014   CLINICAL DATA:  Shortness of breath. Leg pain. Lung mass. Evaluate for pulmonary embolism.  EXAM: NUCLEAR MEDICINE VENTILATION - PERFUSION LUNG SCAN  TECHNIQUE: Ventilation images were obtained in multiple projections using inhaled aerosol Tc-87mDTPA. Perfusion images were obtained in multiple projections after intravenous injection of Tc-932mAA.  RADIOPHARMACEUTICALS:  40 Technetium-99110mPA aerosol inhalation and 6 Technetium-8m5m IV  COMPARISON:  Chest CT - 08/31/2014 ; chest radiograph - 09/08/2014; 08/22/2014  FINDINGS: Chest radiograph performed 09/08/2014 demonstrates grossly unchanged enlarged cardiac silhouette and mediastinal contours with atherosclerotic plaque within the thoracic aorta. Stable positioning of left anterior chest wall single lead pacemaker with lead tip overlying expected location of the right ventricular apex. Dense left basilar consolidative opacities compatible with near collapse of the left lower lobe secondary to suspected endobronchial or pulmonary malignancy.  Ventilation: There is clumping of inhaled radiotracer about the pulmonary hila with expected preferential ventilation of the right lung in comparison to the left. There is decreased ventilation of the left lower lobe compatible with the known left lower lobe pulmonary mass. Photopenic defect is seen at the location of the patient's left anterior chest wall pacemaker power pack. Ingested radiotracer is seen within the hypopharynx.  Perfusion: There is relative homogeneous distribution of injected radiotracer throughout the pulmonary parenchyma with expected matched area of non perfusion involving the left lower lobe at the location of the patient's known left lower lobe pulmonary mass. No discrete mismatched areas of perfusion within the pulmonary parenchyma. Expected focal attenuation is seen at the patient's left anterior chest wall pacemaker power pack site.   IMPRESSION: Triple matched defect involving the left lower lobe at the location of the patient's known left lower lobe endobronchial/perihilar mass. Given the presence of a triple matched defect, technically this CT scan could be characterized as intermediate probability, though would favor this is an expected VQ scan given findings on recently obtained chest CT obtained 08/31/2014 and give lack of any additional mismatched perfusion defects would favor this represents a low probability VQ scan. Clinical correlation is advised. Further evaluation could be performed with lower extremity venous Doppler ultrasound as clinically indicated.  Critical Value/emergent results were called by telephone at the time of interpretation on 09/09/2014 at 11:06 am to Dr. FeinTitus Mouldo verbally acknowledged these results.   Electronically Signed   By: JohnSandi Mariscal.   On: 09/09/2014 11:13     ASSESSMENT / PLAN:  PULMONARY  A:Progressive Left lower lobe obstruction, r/o primary lung cancer, r/o post obstructive PNA, low suspicion PE and VQ low prob and doppler neg= neg PE, OSA P:   pcxr in am Agree for radiation to LLL, consult onc reviewed Home cpap IS  CARDIOVASCULAR A: Lactic acidosis in setting extensive liver mets, NOT sepsis P:  Pos balance goals met 3 liters Even goals today tele  RENAL A:  ARF, Hyponatremia, mild hyperkalemia resolved, ATN, renal cysts on CT,  lactic acidosis in setting Extensive liver mets P:   Renal US neg bmet in am  kayxlate hold further No repeat la required  GASTROINTESTINAL A:  Dysphagia, r/o secondary to lymphadenopathy, LIver masses likely extensive mets, Alk phos elevation P:   Likely alk phos is from mets / liver mets diet  HEMATOLOGIC A:  Leukocytosis, Neg PE work up, leg swelling per pt history P:  Avoiding elaquise given liver mets and ARF Hep drip Will need oral agent started, concern also coumadin with liver  INFECTIOUS A: Post obstructive PNA P:    BCx2 8/26>>> UC 8/26>>> Sputum 8/26>>> Abx Aztreonam 8/26>>>8/27 vanc 8/26>>>8/28 levoflox 8/26>>>  Dc vanc, remains culture neg pcxr LLL with some aeration For rads eval  ENDOCRINE A:  Borderline hypoglycemia improved with diet P:   Glu up with stress roids, maintain, tper in am (cortisol ordered but not in lab result) If glu greater 180 x 2, then dc d5 from fluids then, lower rate today  NEUROLOGIC A:  No pain reported P:   RASS goal: 0   FAMILY  - Updates:   - Inter-disciplinary family meet or Palliative Care meeting due by:9/2  I updated wife and pt in full in room  Lavon Paganini. Titus Mould, MD, [place] Pgr: [facility]  Pulmonary and [place] Pager: [phone]  09/10/2014, 7:42 AM

## 2014-09-10 NOTE — Progress Notes (Signed)
Pt refuses to wear CPAP tonight. Pt encouraged to call RT if pt changes mind. No distress noted.

## 2014-09-10 NOTE — Progress Notes (Signed)
Initial Nutrition Assessment  Pt meets criteria for moderate (non-severe) MALNUTRITION in the context of chronic illness as evidenced by mild fat and moderate muscle mass depletion.  INTERVENTION:   Encourage PO intake and to request supplement if not eating well RD to continue to monitor  NUTRITION DIAGNOSIS:   Swallowing difficulty related to dysphagia as evidenced by per patient/family report.  GOAL:   Patient will meet greater than or equal to 90% of their needs  MONITOR:   PO intake, Labs, Weight trends, Skin, I & O's  REASON FOR ASSESSMENT:   Consult Assessment of nutrition requirement/status  ASSESSMENT:   81 year old male with past mental history of recently diagnosed metastatic lung cancer admitted on the evening of 8/26 with progressively worsening dyspnea on exertion and acute renal failure. Initially thought to be in septic shock from postobstructive pneumonia. Seen by critical care. Critical care clarified that his lactic acidosis was not from sepsis, but from his liver metastases.   Pt in room with family at bedside. Per pt and family, pt has had poor appetite x 3 weeks PTA. Appetite has improved and is now eating 90%. Pt does report difficulty swallowing for months now. Pt has difficulty with liquids. SLP eval has been ordered. Will monitor for results. Per weight history, pt has had recent weight gain. Pt with history of CHF.  Nutrition-Focused physical exam completed. Findings are mild fat depletion, moderate muscle depletion, and mild edema.   Labs reviewed: CBGs: 138-156 Elevated BUN & Creatinine  Diet Order:  Diet heart healthy/carb modified Room service appropriate?: Yes; Fluid consistency:: Thin  Skin:  Reviewed, no issues  Last BM:  8/26  Height:   Ht Readings from Last 1 Encounters:  09/08/14 '5\' 9"'$  (1.753 m)    Weight:   Wt Readings from Last 1 Encounters:  09/10/14 212 lb 15.4 oz (96.6 kg)    Ideal Body Weight:  72.7 kg  BMI:  Body  mass index is 31.43 kg/(m^2).  Estimated Nutritional Needs:   Kcal:  2100-2300  Protein:  115-125g  Fluid:  2.1L/day  EDUCATION NEEDS:   Education needs addressed  Clayton Bibles, MS, RD, LDN Pager: [phone] After Hours Pager: [phone]

## 2014-09-11 ENCOUNTER — Ambulatory Visit
Admit: 2014-09-11 | Discharge: 2014-09-11 | Disposition: A | Discharge status: Home / Self Care | Payer: Commercial Managed Care - HMO | Source: Ambulatory Visit | Attending: Radiation Oncology | Admitting: Radiation Oncology

## 2014-09-11 ENCOUNTER — Encounter: Payer: Self-pay | Admitting: *Deleted

## 2014-09-11 ENCOUNTER — Telehealth: Payer: Self-pay | Admitting: Oncology

## 2014-09-11 ENCOUNTER — Ambulatory Visit
Admit: 2014-09-11 | Discharge: 2014-09-11 | Disposition: A | Discharge status: Home / Self Care | Payer: Commercial Managed Care - HMO | Attending: Radiation Oncology | Admitting: Radiation Oncology

## 2014-09-11 DIAGNOSIS — C799 Secondary malignant neoplasm of unspecified site: Secondary | ICD-10-CM

## 2014-09-11 DIAGNOSIS — Z510 Encounter for antineoplastic radiation therapy: Secondary | ICD-10-CM | POA: Insufficient documentation

## 2014-09-11 DIAGNOSIS — C3432 Malignant neoplasm of lower lobe, left bronchus or lung: Secondary | ICD-10-CM | POA: Insufficient documentation

## 2014-09-11 DIAGNOSIS — E440 Moderate protein-calorie malnutrition: Secondary | ICD-10-CM

## 2014-09-11 LAB — BASIC METABOLIC PANEL
Anion gap: 27 — ABNORMAL HIGH (ref 5–15)
BUN: 80 mg/dL — AB (ref 6–20)
CALCIUM: 9.4 mg/dL (ref 8.9–10.3)
CO2: 12 mmol/L — ABNORMAL LOW (ref 22–32)
CREATININE: 2.23 mg/dL — AB (ref 0.61–1.24)
Chloride: 97 mmol/L — ABNORMAL LOW (ref 101–111)
GFR calc Af Amer: 30 mL/min — ABNORMAL LOW (ref >60)
GFR, EST NON AFRICAN AMERICAN: 26 mL/min — AB (ref >60)
Glucose, Bld: 118 mg/dL — ABNORMAL HIGH (ref 65–99)
POTASSIUM: 4 mmol/L (ref 3.5–5.1)
SODIUM: 136 mmol/L (ref 135–145)

## 2014-09-11 LAB — LEGIONELLA ANTIGEN, URINE

## 2014-09-11 LAB — GLUCOSE, CAPILLARY
Glucose-Capillary: 132 mg/dL — ABNORMAL HIGH (ref 65–99)
Glucose-Capillary: 111 mg/dL — ABNORMAL HIGH (ref 65–99)
Glucose-Capillary: 135 mg/dL — ABNORMAL HIGH (ref 65–99)
Glucose-Capillary: 121 mg/dL — ABNORMAL HIGH (ref 65–99)

## 2014-09-11 LAB — HEMOGLOBIN A1C
HEMOGLOBIN A1C: 5.6 % (ref 4.8–5.6)
Hgb A1c MFr Bld: 5.4 % (ref 4.8–5.6)
MEAN PLASMA GLUCOSE: 108 mg/dL
MEAN PLASMA GLUCOSE: 114 mg/dL

## 2014-09-11 LAB — CBC
HCT: 32.9 % — ABNORMAL LOW (ref 39.0–52.0)
Hemoglobin: 10.6 g/dL — ABNORMAL LOW (ref 13.0–17.0)
MCH: 23.8 pg — AB (ref 26.0–34.0)
MCHC: 32.2 g/dL (ref 30.0–36.0)
MCV: 73.9 fL — ABNORMAL LOW (ref 78.0–100.0)
PLATELETS: 232 10*3/uL (ref 150–400)
RBC: 4.45 MIL/uL (ref 4.22–5.81)
RDW: 17.6 % — ABNORMAL HIGH (ref 11.5–15.5)
WBC: 22.4 10*3/uL — ABNORMAL HIGH (ref 4.0–10.5)

## 2014-09-11 LAB — HEPARIN LEVEL (UNFRACTIONATED)
HEPARIN UNFRACTIONATED: 0.69 [IU]/mL (ref 0.30–0.70)
Heparin Unfractionated: 0.73 IU/mL — ABNORMAL HIGH (ref 0.30–0.70)

## 2014-09-11 LAB — CORTISOL: CORTISOL PLASMA: 82.2 ug/dL

## 2014-09-11 MED ORDER — IPRATROPIUM-ALBUTEROL 0.5-2.5 (3) MG/3ML IN SOLN
3.0000 mL | Freq: Four times a day (QID) | RESPIRATORY_TRACT | Status: DC
  Administered 2014-09-11 – 2014-09-13 (×9): 3 mL via RESPIRATORY_TRACT
  Filled 2014-09-11 (×9): qty 3

## 2014-09-11 MED ORDER — FUROSEMIDE 10 MG/ML IJ SOLN
40.0000 mg | Freq: Two times a day (BID) | INTRAMUSCULAR | Status: DC
  Administered 2014-09-11 – 2014-09-14 (×7): 40 mg via INTRAVENOUS
  Filled 2014-09-11 (×7): qty 4

## 2014-09-11 MED ORDER — LEVOFLOXACIN 750 MG PO TABS
750.0000 mg | ORAL_TABLET | ORAL | Status: DC
  Administered 2014-09-13 – 2014-09-17 (×3): 750 mg via ORAL
  Filled 2014-09-11 (×3): qty 1

## 2014-09-11 MED ORDER — METHYLPREDNISOLONE SODIUM SUCC 40 MG IJ SOLR
40.0000 mg | Freq: Two times a day (BID) | INTRAMUSCULAR | Status: DC
  Administered 2014-09-11 – 2014-09-12 (×3): 40 mg via INTRAVENOUS
  Filled 2014-09-11 (×3): qty 1

## 2014-09-11 NOTE — Consult Note (Signed)
PULMONARY / CRITICAL CARE MEDICINE   Name: Bradley Rasmussen MRN: [id number] DOB: July 31, 1931    ADMISSION DATE:  09/08/2014 CONSULTATION DATE:  09/09/14  REFERRING MD :  [facility]  CHIEF COMPLAINT:  Lactic acidosis, SOB  INITIAL PRESENTATION:  Recent Bx Liver with cancer, SOB, now lactic acidosis  HISTORY OF PRESENT ILLNESS: 81 yr old male where 2 Weeks ago patient was diagnosed with left lower lobe lung mass concerning for obstruciton with metastasis to the liver. Patient has 40 pack years smoking history. Patient has been followed by pulmonology Dr [place] Bells. On 23 of August patient undergone liver lesion biopsy results showing poorly differentiated malignant lesion.  Presented with progressively worsening shortness of breath and generalized weakness. Patient reports gradual worsening in shortness of breath. He has been having some abdominal pain. Also noted some issues swallowing. This has been getting worse over past few days. On admission noted alk phos elevation, WBC up and lactic acidosis. Worsening renal fxn.  STUDIES:  CT chest 8/18>>>Worsening LLL atx concerning for obstruction, mass, liver mets, Lymphadenopathy CT head 8/27>>>neg 8/27 VQ >>>low prob 8/28 renal US>>>no hydro, med dz, bilat cysts 8/28 doppler legs>>>neg (had unilateral leg swelling reported)  SIGNIFICANT EVENTS: 8/23- liver bx, left mass lung on CT chest on 18 th worsening atx 8/26- admitted worsening sob, lactic acidosis, change to heparin drip 8/28- no distress, on heparin   ROS c/o dyspnea, no cough or chest pain , feels better lying down   VITAL SIGNS: Temp:  [97.8 F (36.6 C)-99.4 F (37.4 C)] 97.8 F (36.6 C) (08/29 0439) Pulse Rate:  [75-86] 78 (08/29 0439) Resp:  [18-22] 22 (08/28 2040) BP: (125-136)/(63-77) 136/75 mmHg (08/29 0439) SpO2:  [95 %-99 %] 95 % (08/29 0749) HEMODYNAMICS:   VENTILATOR SETTINGS:   INTAKE / OUTPUT:  Intake/Output Summary (Last 24 hours) at 09/11/14 1150 Last data  filed at 09/11/14 0600  Gross per 24 hour  Intake 2731.78 ml  Output    800 ml  Net 1931.78 ml    PHYSICAL EXAMINATION: General:  Mild respiratory distress while supine  Neuro:  Nonfocal, alert , nonfocal HEENT:  jvd  wnl Cardiovascular: s1 s2 rrr paced Lungs:  Left base reduced slight ronchi/ reduced Abdomen:  Liver palpated, bs wnl, no r/g Musculoskeletal:  No rash, rt leg equal size left Skin:  No rash  LABS:  CBC  Recent Labs Lab 09/08/14 1604 09/10/14 0524 09/11/14 0500  WBC 14.0* 19.7* 22.4*  HGB 11.8* 10.8* 10.6*  HCT 36.4* 33.3* 32.9*  PLT 184 230 232   Coag's  Recent Labs Lab 09/05/14 0852 09/08/14 1604 09/09/14 0400 09/09/14 2022 09/10/14 0524 09/10/14 1432  APTT 28  --  29 55* 80* 97*  INR 1.16 1.18 1.30  --   --   --    BMET  Recent Labs Lab 09/09/14 2022 09/10/14 0524 09/11/14 0500  NA 137 137 136  K 4.8 4.0 4.0  CL 96* 97* 97*  CO2 12* 13* 12*  BUN 74* 75* 80*  CREATININE 2.33* 2.31* 2.23*  GLUCOSE 154* 128* 118*   Electrolytes  Recent Labs Lab 09/09/14 2022 09/10/14 0524 09/11/14 0500  CALCIUM 9.5 9.4 9.4   Sepsis Markers  Recent Labs Lab 09/08/14 2200 09/09/14 0051 09/09/14 0400  LATICACIDVEN 10.7* 12.7* 13.5*  PROCALCITON 1.70  --   --    ABG No results for input(s): PHART, PCO2ART, PO2ART in the last 168 hours. Liver Enzymes  Recent Labs Lab 09/08/14 1604 09/09/14 0400  AST 115*  110*  ALT 47 <5*  ALKPHOS 490* 474*  BILITOT 1.0 1.3*  ALBUMIN 2.5* 2.4*   Cardiac Enzymes  Recent Labs Lab 09/08/14 2200 09/09/14 0400 09/09/14 1220  TROPONINI 0.04* 0.03 0.03   Glucose  Recent Labs Lab 09/10/14 0345 09/10/14 0736 09/10/14 1150 09/10/14 1727 09/10/14 2211 09/11/14 0733  GLUCAP 140* 138* 151* 134* 144* 132*    Imaging No results found.   ASSESSMENT / PLAN:  PULMONARY  A:Progressive Left lower lobe obstruction, r/o primary lung cancer, r/o post obstructive PNA, low suspicion PE and VQ low  prob and doppler neg= neg PE, OSA Bronchospasm - ? Fluid overload,  treat as COPD flare P:   Simulation planned for radiation today-this would be palliative the left lower lobe Home cpap IS Solumedrol 40 q 12h -dc once wheezing stops  CARDIOVASCULAR A: Lactic acidosis in setting extensive liver mets, NOT sepsis Chr systolic CHF -PPM P: gentle diuresis ok Need clearance for RT   RENAL A:  AKI on stg 3 CKD, Hyponatremia, mild hyperkalemia resolved, ATN, renal cysts on CT, lactic acidosis in setting Extensive liver mets Renal US neg P:   Lasix 40 q 12   GASTROINTESTINAL A:  Dysphagia, r/o secondary to lymphadenopathy, LIver masses likely extensive mets, Alk phos elevation P:   Likely alk phos is from mets / liver mets diet  HEMATOLOGIC A:  Leukocytosis, Neg PE work up, leg swelling per pt history P:  Dc eliquise given liver mets and ARF Hep drip   INFECTIOUS A: Post obstructive PNA P:   BCx2 8/26>>> ng UC 8/26>>> Sputum 8/26>>> Abx Aztreonam 8/26>>>8/27 vanc 8/26>>>8/28 levoflox 8/26>>>  For rads eval  ENDOCRINE A:  Borderline hypoglycemia improved with diet P:   dc stress roids   FAMILY  - Updates: wife at bedside  - Inter-disciplinary family meet or Palliative Care meeting due by:9/2, would need to define advance directives given advanced malignancy  Summary - Metastatic carcinoma to liver , likely lung primary with LLL atelectasis , plan is for palliative RT, also has chr systolic HF with CKD   Kara Mead MD. FCCP. [facility] Pager [phone] If no response call [phone]    09/11/2014, 11:50 AM

## 2014-09-11 NOTE — Progress Notes (Signed)
ANTICOAGULATION CONSULT NOTE -Follow Up Consult  Pharmacy Consult for Heparin Indication: atrial fibrillation  Allergies  Allergen Reactions  . Amoxicillin Itching and Rash    Patient Measurements: Height: '5\' 9"'$  (175.3 cm) Weight: 212 lb 15.4 oz (96.6 kg) IBW/kg (Calculated) : 70.7 Heparin Dosing Weight: 89.3 kg  Vital Signs: Temp: 97.8 F (36.6 C) (08/29 0439) Temp Source: Oral (08/29 0439) BP: 136/75 mmHg (08/29 0439) Pulse Rate: 78 (08/29 0439)  Labs:  Recent Labs  09/08/14 1604 09/08/14 2200  09/09/14 0400 09/09/14 1220 09/09/14 2022 09/10/14 0524 09/10/14 1432 09/11/14 0500  HGB 11.8*  --   --   --   --   --  10.8*  --  10.6*  HCT 36.4*  --   --   --   --   --  33.3*  --  32.9*  PLT 184  --   --   --   --   --  230  --  232  APTT  --   --   < > 29  --  55* 80* 97*  --   LABPROT 15.2  --   --  16.3*  --   --   --   --   --   INR 1.18  --   --  1.30  --   --   --   --   --   HEPARINUNFRC  --   --   --   --  0.74*  --  0.65  --  0.73*  CREATININE 2.14* 2.10*  --  2.13* 2.17* 2.33* 2.31*  --  2.23*  CKTOTAL 24*  --   --   --   --   --   --   --   --   TROPONINI 0.03 0.04*  --  0.03 0.03  --   --   --   --   < > = values in this interval not displayed.  Estimated Creatinine Clearance: 30.3 mL/min (by C-G formula based on Cr of 2.23).   Medical History: Past Medical History  Diagnosis Date  . High blood pressure   . Irregular heart rhythm   . Asthma   . Diabetes mellitus   . High cholesterol   . Kidney disease   . Sleep apnea   . Sickle cell trait   . A-fib   . Complete heart block   . Pacemaker     Medications:  Scheduled:  . carvedilol  12.5 mg Oral BID WC  . guaiFENesin  600 mg Oral BID  . hydrocortisone sodium succinate  50 mg Intravenous Q6H  . insulin aspart  0-5 Units Subcutaneous QHS  . insulin aspart  0-9 Units Subcutaneous TID WC  . ipratropium-albuterol  3 mL Nebulization TID  . levofloxacin (LEVAQUIN) IV  750 mg Intravenous Q48H  .  polyethylene glycol  17 g Oral Daily   Infusions:  . sodium chloride 10 mL/hr at 09/10/14 1037  . sodium chloride 75 mL/hr at 09/11/14 0149  . heparin 1,300 Units/hr (09/10/14 2341)    Assessment: 81 year old male recently diagnosed with left lower lobe lung mass with metastasis to the liver, s/p biopsy on 09/05/14.  He was admitted 09/08/14 with worsening shortness of breath and weakness.    Patient is taking apixaban (eliquis) for chronic Afib.  Given ARF and compromised hepatic function, CCM has consulted Pharmacy to transition to IV heparin.   Last dose of apixaban documented 8/26 at 23:25.  Today, 09/11/2014  Heparin level = 0.73 (supratherapeutic)  Hgb slightly decreased to 10.6, Plts stable  No complications of therapy noted  Goal of Therapy:  Heparin level 0.3-0.7 units/ml  Monitor platelets by anticoagulation protocol: Yes   Plan:   Decrease  Heparin IV infusion to 1200 units/hr  Recheck aPTT in 8 hrs to verify therapeutic  Check daily heparin level  Check daily CBC  Dolly Rias RPh 09/11/2014, 7:49 AM Pager [phone]

## 2014-09-11 NOTE — Progress Notes (Signed)
PHARMACIST - PHYSICIAN COMMUNICATION CONCERNING:  IV Heparin  62 yoM transitioned from apixaban to IV heparin for Afib 2/2 ARF and compromised liver function.  Please see note written earlier today by Angela Adam, PharmD for full details.    Heparin infusion rate decreased due to supratherapeutic heparin level earlier today.  Heparin level tonight  = 0.69, remains borderline elevated. No issues per RN.    RECOMMENDATION: Reduce heparin infusion rate from 1200 units/hr to 1100 units/hr.  Recheck in 8 hours.   Ralene Bathe, PharmD, BCPS 09/11/2014, 6:30 PM  Pager: [phone]

## 2014-09-11 NOTE — Progress Notes (Signed)
Oncology Nurse Navigator Documentation  Oncology Nurse Navigator Flowsheets 09/11/2014  Referral date to RadOnc/MedOnc 09/08/2014  Interventions Coordination of Care/I received referral on Mr. Carfagno Friday.  He was admitted to the hospital.  He has seen Dr. Sondra Come and Dr. Burr Medico.  He has radiation tx today.  No need to schedule for Axis  Time Spent with Patient 15

## 2014-09-11 NOTE — Progress Notes (Signed)
Pt refuses to wear CPAP. Pt encouraged to call RT if pt changes mind.

## 2014-09-11 NOTE — Telephone Encounter (Addendum)
Called Dr. Julien Nordmann office 3 times regarding pacemaker form that has been faxed to his office.  Requested a return call from his nurse 2 times.Marland Kitchen

## 2014-09-11 NOTE — Evaluation (Addendum)
Clinical/Bedside Swallow Evaluation Patient Details  Name: Bradley Rasmussen MRN: [id number] Date of Birth: [birth year]  Today's Date: 09/11/2014 Time: SLP Start Time (ACUTE ONLY): 82 SLP Stop Time (ACUTE ONLY): 1652 SLP Time Calculation (min) (ACUTE ONLY): 22 min  Past Medical History:  Past Medical History  Diagnosis Date  . High blood pressure   . Irregular heart rhythm   . Asthma   . Diabetes mellitus   . High cholesterol   . Kidney disease   . Sleep apnea   . Sickle cell trait   . A-fib   . Complete heart block   . Pacemaker    Past Surgical History:  Past Surgical History  Procedure Laterality Date  . Left lens implant  07/1994  . Left rotator cuff  03/1995  . Pacemaker insertion  03/1997  . Penile prosthesis implant  08/1997  . Right lens implant  06/1999  . Pacemaker replacement  11/2006  . Circumcision  03/2008   HPI:  81 yo male adm to [facility] 09/08/14 with progressive shortness of breath and weakness/abdominal pan.  PMH + for very recent cancer diagnosis  - ?primary lung with liver mets, asthma, sickle cell carrier, DM, smoker, sleep apnea.  Per [facility] note, pt with " Progressive Left lower lobe obstruction, r/o primary lung cancer, r/o post obstructive PNA, low suspicion PE and VQ low prob and doppler neg= neg PE, OSA,Bronchospasm - ? Fluid overload, treat as COPD flare" .  Pt for palliative radiation per notes.   Dysphagia that has been noted in first few days of admit - [facility] notes - "r/o secondary to lymphadenopathy".   Pt also with CKD and chronic systolic HF per [facility] notes.   Swallow eval ordered by hospitalist.     Assessment / Plan / Recommendation Clinical Impression  Pt presents with no overt indications of airway compromise or dysphagia based on clinical swallow evaluation.  CN exam unremarkable and pt with strong phonation and cough.  Swallow was timely with clear voice throughout.  Subtle throat clearing noted with 50% of liquid boluses - pt states he clears his throat  frequently.    Observed pt to become mildly dyspneic with intake/exertion which may allow episodic aspiration.  Pt reports his transient dysphagia noted upon admit has improved - although he denies any behavioral changes for compensation.  Pt states some coughing/choking on saliva and liquids noted initially that has imrpoved.  He also admits to difficulty swallowing large pills such as potassium- lodging at times in throat.   Pt denies any reflux or sensation of pharyngeal residuals with food/drink - he does have xerostomia.   SLP educated pt to aspiration precautions given his COPD and reviewed compensation strategies for pill administration.    Will follow up x1 to assure pt tolerating intake and educate him/family to mitigation strategies- pt agreeable to plan.  Recommend continue diet with precautions.     Aspiration Risk  Mild    Diet Recommendation Age appropriate regular solids;Thin   Medication Administration: Whole meds with liquid Compensations: Slow rate (rest if short of breath or coughing)    Other  Recommendations Oral Care Recommendations: Oral care BID   Follow Up Recommendations       Frequency and Duration min 1 x/week  1 week   Pertinent Vitals/Pain Afebrile, decreased      Swallow Study Prior Functional Status   see hhx    General Date of Onset: 09/11/14 Other Pertinent Information: 81 yo male adm to [facility] 09/08/14 with  progressive shortness of breath and weakness/abdominal pan.  PMH + for very recent cancer diagnosis  - ?primary lung with liver mets, asthma, sickle cell carrier, DM, smoker, sleep apnea.  Per [facility] note, pt with " Progressive Left lower lobe obstruction, r/o primary lung cancer, r/o post obstructive PNA, low suspicion PE and VQ low prob and doppler neg= neg PE, OSA,Bronchospasm - ? Fluid overload, treat as COPD flare" .  Pt for palliative radiation per notes.   Dysphagia that has been noted in first few days of admit - [facility] notes - "r/o secondary to  lymphadenopathy".   Pt also with CKD and chronic systolic HF per [facility] notes.   Swallow eval ordered by hospitalist.   Type of Study: Bedside swallow evaluation Diet Prior to this Study: Regular;Thin liquids Temperature Spikes Noted: No Respiratory Status: Supplemental O2 delivered via (comment) History of Recent Intubation: No Behavior/Cognition: Alert;Cooperative;Pleasant mood Oral Cavity - Dentition: Adequate natural dentition/normal for age Self-Feeding Abilities: Able to feed self Patient Positioning: Upright in bed Baseline Vocal Quality: Normal Volitional Cough: Strong Volitional Swallow: Able to elicit    Oral/Motor/Sensory Function Overall Oral Motor/Sensory Function: Appears within functional limits for tasks assessed   Ice Chips Ice chips: Not tested   Thin Liquid Thin Liquid: Within functional limits Presentation: Self Fed;Straw Other Comments: delayed throat clearing noted after swallow with approximately 50% of opportunities     Nectar Thick Nectar Thick Liquid: Not tested   Honey Thick Honey Thick Liquid: Not tested   Puree Puree: Within functional limits Presentation: Self Fed;Spoon   Solid   GO    Solid: Within functional limits Presentation: [place], [place] [facility] SLP [phone]

## 2014-09-11 NOTE — Progress Notes (Signed)
PROGRESS NOTE  Bradley Rasmussen MVH:[id number] DOB: April 16, 1931 DOA: 09/08/2014 PCP: Marco Collie, MD  HPI/Recap of past 68 hours: 81 year old male with past mental history of recently diagnosed metastatic lung cancer admitted on the evening of 8/26 with progressively worsening dyspnea on exertion and acute renal failure. Initially thought to be in septic shock from postobstructive pneumonia. Seen by critical care.  Critical care clarified that his lactic acidosis was not from sepsis, but from his liver metastases. Patient's breathing significantly improved over the initial 24 hours, and he was transferred to the floor.  Today, breathing feels more labored. Patient seen by radiation oncology plan to take him for simulation today and then radiation starting tomorrow.   Assessment/Plan: Active Problems:   Shortness of breath/dyspnea on exertion: Workup in progress. Likely from Postobstructive lung mass  plus underlying COPD from smoking. Continue nebulizers, has stopped IV fluids and IV antibiotics. Started gentle diuresis. Steroids added. Appreciate pulmonary assistance.  Acute kidney injury in the setting of stage III chronic kidney disease: Likely from hypotension from dehydration. not much improvement today. Had received Kayexalate initially. IV fluids stopped secondary to concerns for volume overload.    Hyperkalemia: Noted on admission.status post Kayexalate. Now resolved.    Lactic acidosis: Not sepsis. Lactic acidosis secondary liver metastases  Chronic systolic CHF. Monitoring daily weights, significant increased today.    Metastatic lung carcinoma involving liver with unknown primary site: Oncology seeing.  Palliative radiation recommended. Radiation oncology to see.  Atrial fibrillation: Chads 2 vas score of 5. Initially on eliquis, but with liver metastases and renal dysfunction, changed to IV heparin.    diabetes mellitus controlled with renal disease: Normally on Glucotrol. Checking  A1c. On stress dose steroids.  Code Status: Full code  Family Communication: wife at the bedside   Disposition Plan: Continue in-hospital until radiation therapy set, breathing felt to be stabilized   Consultants:  Oncology  Critical care  Procedures:  None  Antibiotics:  Aztreonam 8/26-8/27  Levaquin 8/26-present (changed to by mouth on 8/29)  Vancomycin 8/26-8/27   Objective: BP 136/75 mmHg  Pulse 78  Temp(Src) 97.8 F (36.6 C) (Oral)  Resp 22  Ht '5\' 9"'$  (1.753 m)  Wt 96.6 kg (212 lb 15.4 oz)  BMI 31.43 kg/m2  SpO2 95%  Intake/Output Summary (Last 24 hours) at 09/11/14 1445 Last data filed at 09/11/14 0600  Gross per 24 hour  Intake 2318.78 ml  Output    800 ml  Net 1518.78 ml   Filed Weights   09/08/14 2050 09/09/14 0543 09/10/14 0500  Weight: 91.1 kg (200 lb 13.4 oz) 91.6 kg (201 lb 15.1 oz) 96.6 kg (212 lb 15.4 oz)    Exam:   General:  Alert and oriented 3, mild distress from labored breathing  Cardiovascular: Regular rate and rhythm, S1-S2  Respiratory: Increase in breathing effort, decreased breath sounds throughout, mild end expiratory wheeze bilaterally   Abdomen: Soft, distended, and nontender, positive bowel sounds  Musculoskeletal: No clubbing or cyanosis, trace edema   Data Reviewed: Basic Metabolic Panel:  Recent Labs Lab 09/09/14 0400 09/09/14 1220 09/09/14 2022 09/10/14 0524 09/11/14 0500  NA 134* 136 137 137 136  K 5.4* 4.6 4.8 4.0 4.0  CL 94* 97* 96* 97* 97*  CO2 17* 17* 12* 13* 12*  GLUCOSE 83 97 154* 128* 118*  BUN 77* 76* 74* 75* 80*  CREATININE 2.13* 2.17* 2.33* 2.31* 2.23*  CALCIUM 9.8 9.8 9.5 9.4 9.4   Liver Function Tests:  Recent Labs Lab 09/08/14  1604 09/09/14 0400  AST 115* 110*  ALT 47 <5*  ALKPHOS 490* 474*  BILITOT 1.0 1.3*  PROT 6.7 6.2*  ALBUMIN 2.5* 2.4*   No results for input(s): LIPASE, AMYLASE in the last 168 hours. No results for input(s): AMMONIA in the last 168  hours. CBC:  Recent Labs Lab 09/05/14 0852 09/08/14 1604 09/10/14 0524 09/11/14 0500  WBC 13.2* 14.0* 19.7* 22.4*  NEUTROABS  --  12.0*  --   --   HGB 11.7* 11.8* 10.8* 10.6*  HCT 35.1* 36.4* 33.3* 32.9*  MCV 71.9* 73.4* 74.3* 73.9*  PLT 181 184 230 232   Cardiac Enzymes:    Recent Labs Lab 09/08/14 1604 09/08/14 2200 09/09/14 0400 09/09/14 1220  CKTOTAL 24*  --   --   --   TROPONINI 0.03 0.04* 0.03 0.03   BNP (last 3 results) No results for input(s): BNP in the last 8760 hours.  ProBNP (last 3 results) No results for input(s): PROBNP in the last 8760 hours.  CBG:  Recent Labs Lab 09/10/14 0736 09/10/14 1150 09/10/14 1727 09/10/14 2211 09/11/14 0733  GLUCAP 138* 151* 134* 144* 132*    Recent Results (from the past 240 hour(s))  Culture, blood (routine x 2)     Status: None (Preliminary result)   Collection Time: 09/08/14  4:27 PM  Result Value Ref Range Status   Specimen Description BLOOD RIGHT ANTECUBITAL  Final   Special Requests BOTTLES DRAWN AEROBIC AND ANAEROBIC 5 CC EA  Final   Culture   Final    NO GROWTH 3 DAYS Performed at [facility]    Report Status PENDING  Incomplete  Culture, blood (routine x 2)     Status: None (Preliminary result)   Collection Time: 09/08/14  4:27 PM  Result Value Ref Range Status   Specimen Description BLOOD RIGHT HAND  Final   Special Requests BOTTLES DRAWN AEROBIC AND ANAEROBIC 5 CC EA  Final   Culture   Final    NO GROWTH 3 DAYS Performed at [facility]    Report Status PENDING  Incomplete  Urine culture     Status: None   Collection Time: 09/08/14  5:39 PM  Result Value Ref Range Status   Specimen Description URINE, CLEAN CATCH  Final   Special Requests Normal  Final   Culture   Final    8,000 COLONIES/mL INSIGNIFICANT GROWTH Performed at [facility]    Report Status 09/10/2014 FINAL  Final  MRSA PCR Screening     Status: None   Collection Time: 09/08/14  9:02 PM  Result Value  Ref Range Status   MRSA by PCR NEGATIVE NEGATIVE Final    Comment:        The GeneXpert MRSA Assay (FDA approved for NASAL specimens only), is one component of a comprehensive MRSA colonization surveillance program. It is not intended to diagnose MRSA infection nor to guide or monitor treatment for MRSA infections.      Studies: No results found.  Scheduled Meds: . carvedilol  12.5 mg Oral BID WC  . furosemide  40 mg Intravenous Q12H  . guaiFENesin  600 mg Oral BID  . insulin aspart  0-5 Units Subcutaneous QHS  . insulin aspart  0-9 Units Subcutaneous TID WC  . ipratropium-albuterol  3 mL Nebulization QID  . [START ON 09/13/2014] levofloxacin  750 mg Oral Q48H  . methylPREDNISolone (SOLU-MEDROL) injection  40 mg Intravenous BID  . polyethylene glycol  17 g Oral Daily  Continuous Infusions: . heparin 1,200 Units/hr (09/11/14 0805)     Time spent: 25 minutes  [place] [facility] Pager [phone]. If 7PM-7AM, please contact night-coverage at [email or web address], password [facility] 09/11/2014, 2:45 PM  LOS: 3 days

## 2014-09-11 NOTE — Care Management Important Message (Signed)
Important Message  Patient Details  Name: Bradley Rasmussen MRN: [id number] Date of Birth: June 13, 1931   Medicare Important Message Given:  [facility] notification given    [name] 09/11/2014, 11:21 AMImportant Message  Patient Details  Name: Bradley Rasmussen MRN: [id number] Date of Birth: Jul 03, 1931   Medicare Important Message Given:  Yes-second notification given    [name] 09/11/2014, 11:21 AM

## 2014-09-11 NOTE — Telephone Encounter (Signed)
Horton Chin, RN on [place] for report on [employer].  She said that he is not having any pain or trouble breathing.  She said he can travel by wheelchair.

## 2014-09-12 ENCOUNTER — Telehealth: Payer: Self-pay | Admitting: Oncology

## 2014-09-12 ENCOUNTER — Ambulatory Visit: Admit: 2014-09-12 | Payer: Commercial Managed Care - HMO | Admitting: Radiation Oncology

## 2014-09-12 DIAGNOSIS — I509 Heart failure, unspecified: Secondary | ICD-10-CM

## 2014-09-12 DIAGNOSIS — C3490 Malignant neoplasm of unspecified part of unspecified bronchus or lung: Secondary | ICD-10-CM | POA: Insufficient documentation

## 2014-09-12 DIAGNOSIS — C3492 Malignant neoplasm of unspecified part of left bronchus or lung: Secondary | ICD-10-CM

## 2014-09-12 DIAGNOSIS — E119 Type 2 diabetes mellitus without complications: Secondary | ICD-10-CM

## 2014-09-12 LAB — GLUCOSE, CAPILLARY
GLUCOSE-CAPILLARY: 104 mg/dL — AB (ref 65–99)
GLUCOSE-CAPILLARY: 101 mg/dL — AB (ref 65–99)
Glucose-Capillary: 121 mg/dL — ABNORMAL HIGH (ref 65–99)
Glucose-Capillary: 124 mg/dL — ABNORMAL HIGH (ref 65–99)

## 2014-09-12 LAB — CBC
HCT: 32.3 % — ABNORMAL LOW (ref 39.0–52.0)
HEMOGLOBIN: 10.5 g/dL — AB (ref 13.0–17.0)
MCH: 23.8 pg — AB (ref 26.0–34.0)
MCHC: 32.5 g/dL (ref 30.0–36.0)
MCV: 73.1 fL — AB (ref 78.0–100.0)
Platelets: 204 10*3/uL (ref 150–400)
RBC: 4.42 MIL/uL (ref 4.22–5.81)
RDW: 17.6 % — ABNORMAL HIGH (ref 11.5–15.5)
WBC: 21.3 10*3/uL — ABNORMAL HIGH (ref 4.0–10.5)

## 2014-09-12 LAB — HEPARIN LEVEL (UNFRACTIONATED)
HEPARIN UNFRACTIONATED: 0.55 [IU]/mL (ref 0.30–0.70)
Heparin Unfractionated: 0.5 IU/mL (ref 0.30–0.70)

## 2014-09-12 NOTE — Telephone Encounter (Signed)
Called Aaron Edelman with [employer].  He will have a representative here at 12:45 tomorrow to check Mr. Seymore pacemaker.  Notified Katie, RT on [place] [facility].

## 2014-09-12 NOTE — Progress Notes (Signed)
PROGRESS NOTE  Bradley Rasmussen NLG:[id number] DOB: [birth year] DOA: 09/08/2014 PCP: Marco Collie, MD  HPI/Recap of past 41 hours: 81 year old male with past mental history of recently diagnosed metastatic lung cancer admitted on the evening of 8/26 with progressively worsening dyspnea on exertion and acute renal failure. Initially thought to be in septic shock from postobstructive pneumonia. Seen by critical care.  Critical care clarified that his lactic acidosis was not from sepsis, but from his liver metastases. Patient's breathing significantly improved over the initial 24 hours, and he was transferred to the floor.  Over the past few days, breathing appeared more labored.swallow eval checked and found to be unrevealing.  IVs stopped and patient started on diuretic. Better today. Has started radiation therapy for palliative options. Pulmonary discontinued steroids   Assessment/Plan: Active Problems:   Shortness of breath/dyspnea on exertion: Workup in progress. Likely from Postobstructive lung mass  plus underlying COPD from smoking. Continue nebulizers, has stopped IV fluids and IV antibiotics. Started gentle diuresis. Steroids stopped Appreciate pulmonary assistance.  Acute kidney injury in the setting of stage III chronic kidney disease: Likely from hypotension from dehydration. not much improvement today. Had received Kayexalate initially. IV fluids stopped secondary to concerns for volume overload.    Hyperkalemia: Noted on admission.status post Kayexalate. Now resolved.    Lactic acidosis: Not sepsis. Lactic acidosis secondary liver metastases  Chronic systolic CHF. Monitoring daily weights, significant increased today.    Metastatic lung carcinoma involving liver with unknown primary site: Oncology seeing.  Palliative radiation recommended. Radiation oncology to see.  Atrial fibrillation: Chads 2 vas score of 5. Initially on eliquis, but with liver metastases and renal dysfunction,  changed to IV heparin.    diabetes mellitus controlled with renal disease: Normally on Glucotrol. A1c only at 5.6. On stress dose steroids.  Code Status: Full code  Family Communication: wife at the bedside   Disposition Plan: Continue in-hospital until radiation therapy set, breathing felt to be stabilized. Anticipate discharge in next 1-2 days   Consultants:  Oncology  Critical care  Procedures:  None  Antibiotics:  Aztreonam 8/26-8/27  Levaquin 8/26-present (changed to by mouth on 8/29)  Vancomycin 8/26-8/27   Objective: BP 129/71 mmHg  Pulse 81  Temp(Src) 97.5 F (36.4 C) (Oral)  Resp 16  Ht '5\' 9"'$  (1.753 m)  Wt 99.5 kg (219 lb 5.7 oz)  BMI 32.38 kg/m2  SpO2 94%  Intake/Output Summary (Last 24 hours) at 09/12/14 1757 Last data filed at 09/12/14 0814  Gross per 24 hour  Intake 1041.6 ml  Output    830 ml  Net  211.6 ml   Filed Weights   09/09/14 0543 09/10/14 0500 09/12/14 0551  Weight: 91.6 kg (201 lb 15.1 oz) 96.6 kg (212 lb 15.4 oz) 99.5 kg (219 lb 5.7 oz)    Exam:   General:  Alert and oriented 3, no acute distress  Cardiovascular: Regular rate and rhythm, S1-S2  Respiratory: Less labored breathing, decreased breath sounds throughout, wheezing resolved  Abdomen: Soft, distended, and nontender, positive bowel sounds  Musculoskeletal: No clubbing or cyanosis, trace edema   Data Reviewed: Basic Metabolic Panel:  Recent Labs Lab 09/09/14 0400 09/09/14 1220 09/09/14 2022 09/10/14 0524 09/11/14 0500  NA 134* 136 137 137 136  K 5.4* 4.6 4.8 4.0 4.0  CL 94* 97* 96* 97* 97*  CO2 17* 17* 12* 13* 12*  GLUCOSE 83 97 154* 128* 118*  BUN 77* 76* 74* 75* 80*  CREATININE 2.13* 2.17* 2.33* 2.31* 2.23*  CALCIUM 9.8 9.8 9.5 9.4 9.4   Liver Function Tests:  Recent Labs Lab 09/08/14 1604 09/09/14 0400  AST 115* 110*  ALT 47 <5*  ALKPHOS 490* 474*  BILITOT 1.0 1.3*  PROT 6.7 6.2*  ALBUMIN 2.5* 2.4*   No results for input(s): LIPASE,  AMYLASE in the last 168 hours. No results for input(s): AMMONIA in the last 168 hours. CBC:  Recent Labs Lab 09/08/14 1604 09/10/14 0524 09/11/14 0500 09/12/14 0250  WBC 14.0* 19.7* 22.4* 21.3*  NEUTROABS 12.0*  --   --   --   HGB 11.8* 10.8* 10.6* 10.5*  HCT 36.4* 33.3* 32.9* 32.3*  MCV 73.4* 74.3* 73.9* 73.1*  PLT 184 230 232 204   Cardiac Enzymes:    Recent Labs Lab 09/08/14 1604 09/08/14 2200 09/09/14 0400 09/09/14 1220  CKTOTAL 24*  --   --   --   TROPONINI 0.03 0.04* 0.03 0.03   BNP (last 3 results) No results for input(s): BNP in the last 8760 hours.  ProBNP (last 3 results) No results for input(s): PROBNP in the last 8760 hours.  CBG:  Recent Labs Lab 09/11/14 1655 09/11/14 2105 09/12/14 0734 09/12/14 1159 09/12/14 1720  GLUCAP 135* 121* 104* 121* 124*    Recent Results (from the past 240 hour(s))  Culture, blood (routine x 2)     Status: None (Preliminary result)   Collection Time: 09/08/14  4:27 PM  Result Value Ref Range Status   Specimen Description BLOOD RIGHT ANTECUBITAL  Final   Special Requests BOTTLES DRAWN AEROBIC AND ANAEROBIC 5 CC EA  Final   Culture   Final    NO GROWTH 4 DAYS Performed at [facility]    Report Status PENDING  Incomplete  Culture, blood (routine x 2)     Status: None (Preliminary result)   Collection Time: 09/08/14  4:27 PM  Result Value Ref Range Status   Specimen Description BLOOD RIGHT HAND  Final   Special Requests BOTTLES DRAWN AEROBIC AND ANAEROBIC 5 CC EA  Final   Culture   Final    NO GROWTH 4 DAYS Performed at [facility]    Report Status PENDING  Incomplete  Urine culture     Status: None   Collection Time: 09/08/14  5:39 PM  Result Value Ref Range Status   Specimen Description URINE, CLEAN CATCH  Final   Special Requests Normal  Final   Culture   Final    8,000 COLONIES/mL INSIGNIFICANT GROWTH Performed at [facility]    Report Status 09/10/2014 FINAL  Final  MRSA PCR  Screening     Status: None   Collection Time: 09/08/14  9:02 PM  Result Value Ref Range Status   MRSA by PCR NEGATIVE NEGATIVE Final    Comment:        The GeneXpert MRSA Assay (FDA approved for NASAL specimens only), is one component of a comprehensive MRSA colonization surveillance program. It is not intended to diagnose MRSA infection nor to guide or monitor treatment for MRSA infections.      Studies: No results found.  Scheduled Meds: . carvedilol  12.5 mg Oral BID WC  . furosemide  40 mg Intravenous Q12H  . guaiFENesin  600 mg Oral BID  . insulin aspart  0-5 Units Subcutaneous QHS  . insulin aspart  0-9 Units Subcutaneous TID WC  . ipratropium-albuterol  3 mL Nebulization QID  . [START ON 09/13/2014] levofloxacin  750 mg Oral Q48H  . polyethylene  glycol  17 g Oral Daily    Continuous Infusions: . heparin 1,100 Units/hr (09/11/14 2158)     Time spent: 25 minutes  [place] [facility] Pager [phone]. If 7PM-7AM, please contact night-coverage at [email or web address], password [facility] 09/12/2014, 5:57 PM  LOS: 4 days

## 2014-09-12 NOTE — Progress Notes (Signed)
  Radiation Oncology         ([phone]) [phone] ________________________________  Name: Bradley Rasmussen MRN: [id number]  Date: 09/11/2014  DOB: 1931-03-21  SIMULATION AND TREATMENT PLANNING NOTE  - inpatient     ICD-9-CM ICD-10-CM   1. Malignant neoplasm of lower lobe of left lung 162.5 C34.32     DIAGNOSIS: Poorly differentiated carcinoma, likely stage IV lung cancer  NARRATIVE:  The patient was brought to the [facility].  Identity was confirmed.  All relevant records and images related to the planned course of therapy were reviewed.  The patient freely provided informed written consent to proceed with treatment after reviewing the details related to the planned course of therapy. The consent form was witnessed and verified by the simulation staff.  Then, the patient was set-up in a stable reproducible  supine position for radiation therapy.  CT images were obtained.  Surface markings were placed.  The CT images were loaded into the planning software.  Then the target and avoidance structures were contoured.  Treatment planning then occurred.  The radiation prescription was entered and confirmed.  Then, I designed and supervised the construction of a total of 3 medically necessary complex treatment devices.  I have requested : 3D Simulation  I have requested a DVH of the following structures: lungs, heart, esophagus, CTV.  I have ordered:diode radiation measurement over pacemaker on first treatment day.  PLAN:  The patient will receive 30 Gy in 10 fractions.  ________________________________  -----------------------------------  Blair Promise, PhD, MD

## 2014-09-12 NOTE — Progress Notes (Signed)
Speech Language Pathology Treatment: Dysphagia  Patient Details Name: Bradley Rasmussen MRN: [id number] DOB: [birth year] Today's Date: 09/12/2014 Time: 1500-1520 SLP Time Calculation (min) (ACUTE ONLY): 20 min  Assessment / Plan / Recommendation Clinical Impression  Pt reports swallow ability to be at baseline level.  SlP observed pt consuming water via straw after RN assisted SLP to reposition pt.  Swallow was timely with clear voice throughout.  No indication of airway compromise.  Pt denies becoming "choked" or having difficulty with any intake since yesterday's evaluation. He did report short term discomfort in his chest that was alleviated with belching.   Pt was taken to radiation for lung radiation and SLP provided spouse with compensation strategies to mitigate dysphagia that may occur with radiation treatment and dysphagia with respiratory deficits.  Spouse reports pt did have difficulty swallowing "for years" that has worsened recently.  She admits pt eats too quickly and she tries to remind him to slow down.  Encouraged spouse to continue to help pt with modification of pace of eating to maximize airway protection.   Spouse was chopping his meats at home and adding flour to cornbread to make it more cohesive- which has been helpful per spouse.  Bradley Rasmussen also reports pt occasionally coughing on liquids.  Dysphagia/coughing with po is intermittent per spouse.  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 Offered to follow up x1 to allow spouse time to review plethora of information provided - she requested follow up. SLP to follow up as requested.    HPI Other Pertinent Information: 81 yo male adm to [facility] 09/08/14 with progressive shortness of breath and weakness/abdominal pan.  PMH + for very recent cancer diagnosis  - ?primary lung with liver mets, asthma, sickle cell carrier, DM, smoker, sleep apnea.   Per [facility] note, pt with " Progressive Left lower lobe obstruction, r/o primary lung cancer, r/o post obstructive PNA, low suspicion PE and VQ low prob and doppler neg= neg PE, OSA,Bronchospasm - ? Fluid overload, treat as COPD flare" .  Pt for palliative radiation per notes.   Dysphagia that has been noted in first few days of admit - [facility] notes - "r/o secondary to lymphadenopathy".   Pt also with CKD and chronic systolic HF per [facility] notes.   Swallow eval ordered by hospitalist.     Pertinent Vitals Pain Assessment: No/denies pain  SLP Plan  Continue with current plan of care    Recommendations Diet recommendations: Regular (chopped meats - order placed) Liquids provided via: Cup;Straw Medication Administration: Whole meds with liquid (with pudding or puree if problematic) Supervision: Patient able to self feed Compensations: Slow rate;Small sips/bites;Follow solids with liquid Postural Changes and/or Swallow Maneuvers: Seated upright 90 degrees;Upright 30-60 min after meal              Follow up Recommendations: None Plan: Continue with current plan of care    [place], [place] SLP [phone]

## 2014-09-12 NOTE — Progress Notes (Signed)
Lothar Prehn   DOB:[birth year]   TO#:[id number]   XIP#:[id number]  Subjective: Saw pt and his wife on floor when he was having lunch. He feels his breathing is slightly better today. He is going to start thoracic radiation this afternoon.    Objective:  Filed Vitals:   09/12/14 2107  BP: 120/72  Pulse: 85  Temp: 97.6 F (36.4 C)  Resp: 16    Body mass index is 32.38 kg/(m^2).  Intake/Output Summary (Last 24 hours) at 09/12/14 2207 Last data filed at 09/12/14 6734  Gross per 24 hour  Intake  801.6 ml  Output    550 ml  Net  251.6 ml     Sclerae unicteric  Oropharynx clear  No peripheral adenopathy  Lungs clear -- no rales or rhonchi  Heart regular rate and rhythm  Abdomen benign  MSK no focal spinal tenderness, no peripheral edema  Neuro nonfocal   CBG (last 3)   Recent Labs  09/12/14 1159 09/12/14 1720 09/12/14 2106  GLUCAP 121* 124* 101*     Labs:  Lab Results  Component Value Date   WBC 21.3* 09/12/2014   HGB 10.5* 09/12/2014   HCT 32.3* 09/12/2014   MCV 73.1* 09/12/2014   PLT 204 09/12/2014   NEUTROABS 12.0* 09/08/2014    '@LASTCHEMISTRY'$ @  Urine Studies No results for input(s): UHGB, CRYS in the last 72 hours.  Invalid input(s): UACOL, UAPR, USPG, UPH, UTP, UGL, UKET, UBIL, UNIT, UROB, [place], UEPI, UWBC, [place], [place], [place], [place], [place]  Basic Metabolic Panel:  Recent Labs Lab 09/09/14 0400 09/09/14 1220 09/09/14 2022 09/10/14 0524 09/11/14 0500  NA 134* 136 137 137 136  K 5.4* 4.6 4.8 4.0 4.0  CL 94* 97* 96* 97* 97*  CO2 17* 17* 12* 13* 12*  GLUCOSE 83 97 154* 128* 118*  BUN 77* 76* 74* 75* 80*  CREATININE 2.13* 2.17* 2.33* 2.31* 2.23*  CALCIUM 9.8 9.8 9.5 9.4 9.4   GFR Estimated Creatinine Clearance: 30.7 mL/min (by C-G formula based on Cr of 2.23). Liver Function Tests:  Recent Labs Lab 09/08/14 1604 09/09/14 0400  AST 115* 110*  ALT 47 <5*  ALKPHOS 490* 474*  BILITOT 1.0 1.3*  PROT 6.7 6.2*  ALBUMIN 2.5* 2.4*   No results for  input(s): LIPASE, AMYLASE in the last 168 hours. No results for input(s): AMMONIA in the last 168 hours. Coagulation profile  Recent Labs Lab 09/08/14 1604 09/09/14 0400  INR 1.18 1.30    CBC:  Recent Labs Lab 09/08/14 1604 09/10/14 0524 09/11/14 0500 09/12/14 0250  WBC 14.0* 19.7* 22.4* 21.3*  NEUTROABS 12.0*  --   --   --   HGB 11.8* 10.8* 10.6* 10.5*  HCT 36.4* 33.3* 32.9* 32.3*  MCV 73.4* 74.3* 73.9* 73.1*  PLT 184 230 232 204   Cardiac Enzymes:  Recent Labs Lab 09/08/14 1604 09/08/14 2200 09/09/14 0400 09/09/14 1220  CKTOTAL 24*  --   --   --   TROPONINI 0.03 0.04* 0.03 0.03   BNP: Invalid input(s): POCBNP CBG:  Recent Labs Lab 09/11/14 2105 09/12/14 0734 09/12/14 1159 09/12/14 1720 09/12/14 2106  GLUCAP 121* 104* 121* 124* 101*   D-Dimer No results for input(s): DDIMER in the last 72 hours. Hgb A1c  Recent Labs  09/10/14 0524  HGBA1C 5.6   Lipid Profile No results for input(s): CHOL, HDL, LDLCALC, TRIG, CHOLHDL, LDLDIRECT in the last 72 hours. Thyroid function studies No results for input(s): TSH, T4TOTAL, T3FREE, THYROIDAB in the last 72 hours.  Invalid input(s): FREET3 Anemia work up No results for input(s): VITAMINB12, FOLATE, FERRITIN, TIBC, IRON, RETICCTPCT in the last 72 hours. Microbiology Recent Results (from the past 240 hour(s))  Culture, blood (routine x 2)     Status: None (Preliminary result)   Collection Time: 09/08/14  4:27 PM  Result Value Ref Range Status   Specimen Description BLOOD RIGHT ANTECUBITAL  Final   Special Requests BOTTLES DRAWN AEROBIC AND ANAEROBIC 5 CC EA  Final   Culture   Final    NO GROWTH 4 DAYS Performed at [facility]    Report Status PENDING  Incomplete  Culture, blood (routine x 2)     Status: None (Preliminary result)   Collection Time: 09/08/14  4:27 PM  Result Value Ref Range Status   Specimen Description BLOOD RIGHT HAND  Final   Special Requests BOTTLES DRAWN AEROBIC AND  ANAEROBIC 5 CC EA  Final   Culture   Final    NO GROWTH 4 DAYS Performed at [facility]    Report Status PENDING  Incomplete  Urine culture     Status: None   Collection Time: 09/08/14  5:39 PM  Result Value Ref Range Status   Specimen Description URINE, CLEAN CATCH  Final   Special Requests Normal  Final   Culture   Final    8,000 COLONIES/mL INSIGNIFICANT GROWTH Performed at [facility]    Report Status 09/10/2014 FINAL  Final  MRSA PCR Screening     Status: None   Collection Time: 09/08/14  9:02 PM  Result Value Ref Range Status   MRSA by PCR NEGATIVE NEGATIVE Final    Comment:        The GeneXpert MRSA Assay (FDA approved for NASAL specimens only), is one component of a comprehensive MRSA colonization surveillance program. It is not intended to diagnose MRSA infection nor to guide or monitor treatment for MRSA infections.       Studies:  No results found.  Assessment: 81 y.o. with recently diagnosed metastatic lung cancer   1. Metastatic lung cancer to liver, bone  2. Dyspnea secondary to LLL obstruction and consolidation  3. Anorexia and weight loss 4. AKI, persistent  5. CHF 6. AF 7. DM   Plan:  -he is starting palliative RT today -continue supportive care -overall very poor prognosis, due to the advanced disease, age and organ multiple failures  -I will see him within one week after his discharge -If his dyspnea and PS does not improve in a few weeks, then we will discuss the goal of care again.    Truitt Merle, MD 09/12/2014  10:07 PM

## 2014-09-12 NOTE — Progress Notes (Signed)
Pt refuses to wear cpap.  Pt was advised that RT is available all night and encouraged him to call, should he change his mind.

## 2014-09-12 NOTE — Progress Notes (Signed)
ANTICOAGULATION CONSULT NOTE -Follow Up Consult  Pharmacy Consult for Heparin Indication: atrial fibrillation  Allergies  Allergen Reactions  . Amoxicillin Itching and Rash    Patient Measurements: Height: '5\' 9"'$  (175.3 cm) Weight: 219 lb 5.7 oz (99.5 kg) IBW/kg (Calculated) : 70.7 Heparin Dosing Weight: 89.3 kg  Vital Signs: Temp: 97.5 F (36.4 C) (08/30 0551) Temp Source: Oral (08/30 0551) BP: 121/72 mmHg (08/30 0955) Pulse Rate: 56 (08/30 0955)  Labs:  Recent Labs  09/09/14 1220 09/09/14 2022  09/10/14 0524 09/10/14 1432 09/11/14 0500 09/11/14 1624 09/12/14 0250 09/12/14 1112  HGB  --   --   < > 10.8*  --  10.6*  --  10.5*  --   HCT  --   --   --  33.3*  --  32.9*  --  32.3*  --   PLT  --   --   --  230  --  232  --  204  --   APTT  --  55*  --  80* 97*  --   --   --   --   HEPARINUNFRC 0.74*  --   --  0.65  --  0.73* 0.69 0.55 0.50  CREATININE 2.17* 2.33*  --  2.31*  --  2.23*  --   --   --   TROPONINI 0.03  --   --   --   --   --   --   --   --   < > = values in this interval not displayed.  Estimated Creatinine Clearance: 30.7 mL/min (by C-G formula based on Cr of 2.23).   Medical History: Past Medical History  Diagnosis Date  . High blood pressure   . Irregular heart rhythm   . Asthma   . Diabetes mellitus   . High cholesterol   . Kidney disease   . Sleep apnea   . Sickle cell trait   . A-fib   . Complete heart block   . Pacemaker     Medications:  Scheduled:  . carvedilol  12.5 mg Oral BID WC  . furosemide  40 mg Intravenous Q12H  . guaiFENesin  600 mg Oral BID  . insulin aspart  0-5 Units Subcutaneous QHS  . insulin aspart  0-9 Units Subcutaneous TID WC  . ipratropium-albuterol  3 mL Nebulization QID  . [START ON 09/13/2014] levofloxacin  750 mg Oral Q48H  . methylPREDNISolone (SOLU-MEDROL) injection  40 mg Intravenous BID  . polyethylene glycol  17 g Oral Daily   Infusions:  . heparin 1,100 Units/hr (09/11/14 2158)     Assessment: 81 year old male recently diagnosed with left lower lobe lung mass with metastasis to the liver, s/p biopsy on 09/05/14.  He was admitted 09/08/14 with worsening shortness of breath and weakness.    Patient is taking apixaban (eliquis) for chronic Afib.  Given ARF and compromised hepatic function, CCM has consulted Pharmacy to transition to IV heparin.   Last dose of apixaban documented 8/26 at 23:25. 09/11/14  Heparin level = 0.73 (supratherapeutic)  Hgb slightly decreased to 10.6, Plts stable  No complications of therapy noted  1624 HL=0.69, reduce infusion to 1100 Today, 09/12/14  0250 HL=0.55, no problems reported per RN  1100 HL=0.5, no problems reported per RN   Goal of Therapy:  Heparin level 0.3-0.7 units/ml  Monitor platelets by anticoagulation protocol: Yes   Plan:   Continue heparin drip @ 1100 units/hr  Check daily heparin level  Check  daily CBC  Dolly Rias RPh 09/12/2014, 12:19 PM Pager [phone]

## 2014-09-12 NOTE — Consult Note (Signed)
PULMONARY / CRITICAL CARE MEDICINE   Name: Bradley Rasmussen MRN: [id number] DOB: 1931-11-23    ADMISSION DATE:  09/08/2014 CONSULTATION DATE:  09/09/14  REFERRING MD :  [facility]  CHIEF COMPLAINT:  Lactic acidosis, SOB  INITIAL PRESENTATION:  Recent Bx Liver with cancer, SOB, now lactic acidosis  HISTORY OF PRESENT ILLNESS: 81 yr old male where 2 Weeks ago patient was diagnosed with left lower lobe lung mass concerning for obstruciton with metastasis to the liver. Patient has 40 pack years smoking history. Patient has been followed by pulmonology Dr [place]. On 23 of August patient undergone liver lesion biopsy results showing poorly differentiated malignant lesion.  Presented with progressively worsening shortness of breath and generalized weakness. Patient reports gradual worsening in shortness of breath. He has been having some abdominal pain. Also noted some issues swallowing. This has been getting worse over past few days. On admission noted alk phos elevation, WBC up and lactic acidosis. Worsening renal fxn.  STUDIES:  CT chest 8/18>>>Worsening LLL atx concerning for obstruction, mass, liver mets, Lymphadenopathy CT head 8/27>>>neg 8/27 VQ >>>low prob 8/28 renal US>>>no hydro, med dz, bilat cysts 8/28 doppler legs>>>neg (had unilateral leg swelling reported)  SIGNIFICANT EVENTS: 8/23- liver bx, left mass lung on CT chest on 18 th worsening atx 8/26- admitted worsening sob, lactic acidosis, change to heparin drip 8/28- no distress, on heparin   ROS c/o dyspnea -improved , no cough or chest pain , feels better  Diuresed some with lasix   VITAL SIGNS: Temp:  [97.5 F (36.4 C)-98.3 F (36.8 C)] 97.5 F (36.4 C) (08/30 0551) Pulse Rate:  [56-85] 56 (08/30 0955) Resp:  [20] 20 (08/30 0955) BP: (121-133)/(72-78) 121/72 mmHg (08/30 0955) SpO2:  [95 %-98 %] 97 % (08/30 0955) Weight:  [219 lb 5.7 oz (99.5 kg)] 219 lb 5.7 oz (99.5 kg) (08/30 0551) HEMODYNAMICS:   VENTILATOR  SETTINGS:   INTAKE / OUTPUT:  Intake/Output Summary (Last 24 hours) at 09/12/14 1240 Last data filed at 09/12/14 0823  Gross per 24 hour  Intake 1041.6 ml  Output    830 ml  Net  211.6 ml    PHYSICAL EXAMINATION: General:  No respiratory distress while supine  Neuro:  Nonfocal, alert , nonfocal HEENT:  jvd  wnl Cardiovascular: s1 s2 rrr paced Lungs:  Left base reduced slight ronchi/ reduced Abdomen:  Liver palpated, bs wnl, no r/g Musculoskeletal:  No rash, rt leg equal size left Skin:  No rash  LABS:  CBC  Recent Labs Lab 09/10/14 0524 09/11/14 0500 09/12/14 0250  WBC 19.7* 22.4* 21.3*  HGB 10.8* 10.6* 10.5*  HCT 33.3* 32.9* 32.3*  PLT 230 232 204   Coag's  Recent Labs Lab 09/08/14 1604  09/09/14 0400 09/09/14 2022 09/10/14 0524 09/10/14 1432  APTT  --   < > 29 55* 80* 97*  INR 1.18  --  1.30  --   --   --   < > = values in this interval not displayed. BMET  Recent Labs Lab 09/09/14 2022 09/10/14 0524 09/11/14 0500  NA 137 137 136  K 4.8 4.0 4.0  CL 96* 97* 97*  CO2 12* 13* 12*  BUN 74* 75* 80*  CREATININE 2.33* 2.31* 2.23*  GLUCOSE 154* 128* 118*   Electrolytes  Recent Labs Lab 09/09/14 2022 09/10/14 0524 09/11/14 0500  CALCIUM 9.5 9.4 9.4   Sepsis Markers  Recent Labs Lab 09/08/14 2200 09/09/14 0051 09/09/14 0400  LATICACIDVEN 10.7* 12.7* 13.5*  PROCALCITON 1.70  --   --  ABG No results for input(s): PHART, PCO2ART, PO2ART in the last 168 hours. Liver Enzymes  Recent Labs Lab 09/08/14 1604 09/09/14 0400  AST 115* 110*  ALT 47 <5*  ALKPHOS 490* 474*  BILITOT 1.0 1.3*  ALBUMIN 2.5* 2.4*   Cardiac Enzymes  Recent Labs Lab 09/08/14 2200 09/09/14 0400 09/09/14 1220  TROPONINI 0.04* 0.03 0.03   Glucose  Recent Labs Lab 09/11/14 0733 09/11/14 1213 09/11/14 1655 09/11/14 2105 09/12/14 0734 09/12/14 1159  GLUCAP 132* 111* 135* 121* 104* 121*    Imaging No results found.   ASSESSMENT /  PLAN:  PULMONARY  A:Progressive Left lower lobe obstruction, r/o primary lung cancer, r/o post obstructive PNA, low suspicion PE and VQ low prob and doppler neg= neg PE, OSA Bronchospasm - ? Fluid overload,  treat as COPD flare P:   RT starting today -this would be palliative to left lower lobe Home cpap IS Solumedrol 40 q 12h -dc now that wheezing stops  CARDIOVASCULAR A: Lactic acidosis in setting extensive liver mets, NOT sepsis Chr systolic CHF -PPM P: gentle diuresis ok   RENAL A:  AKI on stg 3 CKD, Hyponatremia, mild hyperkalemia resolved, ATN, renal cysts on CT, lactic acidosis in setting Extensive liver mets Renal US neg P:   Lasix 40 q 12   GASTROINTESTINAL A:  Dysphagia, r/o secondary to lymphadenopathy, LIver masses likely extensive mets, Alk phos elevation P:   Likely alk phos is from mets / liver mets diet  HEMATOLOGIC A:  Leukocytosis, Neg PE work up, leg swelling per pt history P:  Dc eliquis given liver mets and ARF Hep drip - ? Coumadin eventual   INFECTIOUS A: Post obstructive PNA P:   BCx2 8/26>>> ng UC 8/26>>> Sputum 8/26>>> Abx Aztreonam 8/26>>>8/27 vanc 8/26>>>8/28 levoflox 8/26>>>  10-14 ds total abx  ENDOCRINE A:  Borderline hypoglycemia improved with diet P:   dc stress roids   FAMILY  - Updates: wife at bedside  - Inter-disciplinary family meet or Palliative Care meeting due by:9/2, would need to define advance directives given advanced malignancy  Summary - Metastatic carcinoma to liver , likely lung primary with LLL atelectasis , plan is for palliative RT, also has chr systolic HF with CKD  PCCm to sign off   Kara Mead MD. [place]. [place] [facility] Pager [phone] If no response call [phone]    09/12/2014, 12:40 PM

## 2014-09-12 NOTE — Care Management Note (Signed)
Case Management Note  Patient Details  Name: Bradley Rasmussen MRN: [id number] Date of Birth: 08-May-1931  Subjective/Objective: 81 y/o m admitted w/sob.ARF,post obstructive pna, lactic acidosis,Hx:Lung Ca w/liver mets.  From home.                 Action/Plan:Would recommend PT cons when appropriate.   Expected Discharge Date:                  Expected Discharge Plan:  [place]  In-House Referral:     Discharge planning Services  CM Consult  Post Acute Care Choice:    Choice offered to:     DME Arranged:    DME Agency:     HH Arranged:    [place] Agency:     Status of Service:  In process, will continue to follow  Medicare Important Message Given:  Yes-second notification given Date Medicare IM Given:    Medicare IM give by:    Date Additional Medicare IM Given:    Additional Medicare Important Message give by:     If discussed at [place] of Stay Meetings, dates discussed:    Additional Comments:  Dessa Phi, RN 09/12/2014, 1:46 PM

## 2014-09-12 NOTE — Progress Notes (Signed)
ANTICOAGULATION CONSULT NOTE -Follow Up Consult  Pharmacy Consult for Heparin Indication: atrial fibrillation  Allergies  Allergen Reactions  . Amoxicillin Itching and Rash    Patient Measurements: Height: '5\' 9"'$  (175.3 cm) Weight: 212 lb 15.4 oz (96.6 kg) IBW/kg (Calculated) : 70.7 Heparin Dosing Weight: 89.3 kg  Vital Signs: Temp: 98.3 F (36.8 C) (08/30 0203) Temp Source: Oral (08/30 0203) BP: 133/78 mmHg (08/30 0203) Pulse Rate: 85 (08/30 0203)  Labs:  Recent Labs  09/09/14 0400  09/09/14 1220 09/09/14 2022  09/10/14 0524 09/10/14 1432 09/11/14 0500 09/11/14 1624 09/12/14 0250  HGB  --   --   --   --   < > 10.8*  --  10.6*  --  10.5*  HCT  --   --   --   --   --  33.3*  --  32.9*  --  32.3*  PLT  --   --   --   --   --  230  --  232  --  204  APTT 29  --   --  55*  --  80* 97*  --   --   --   LABPROT 16.3*  --   --   --   --   --   --   --   --   --   INR 1.30  --   --   --   --   --   --   --   --   --   HEPARINUNFRC  --   < > 0.74*  --   --  0.65  --  0.73* 0.69 0.55  CREATININE 2.13*  --  2.17* 2.33*  --  2.31*  --  2.23*  --   --   TROPONINI 0.03  --  0.03  --   --   --   --   --   --   --   < > = values in this interval not displayed.  Estimated Creatinine Clearance: 30.3 mL/min (by C-G formula based on Cr of 2.23).   Medical History: Past Medical History  Diagnosis Date  . High blood pressure   . Irregular heart rhythm   . Asthma   . Diabetes mellitus   . High cholesterol   . Kidney disease   . Sleep apnea   . Sickle cell trait   . A-fib   . Complete heart block   . Pacemaker     Medications:  Scheduled:  . carvedilol  12.5 mg Oral BID WC  . furosemide  40 mg Intravenous Q12H  . guaiFENesin  600 mg Oral BID  . insulin aspart  0-5 Units Subcutaneous QHS  . insulin aspart  0-9 Units Subcutaneous TID WC  . ipratropium-albuterol  3 mL Nebulization QID  . [START ON 09/13/2014] levofloxacin  750 mg Oral Q48H  . methylPREDNISolone  (SOLU-MEDROL) injection  40 mg Intravenous BID  . polyethylene glycol  17 g Oral Daily   Infusions:  . heparin 1,100 Units/hr (09/11/14 2158)    Assessment: 81 year old male recently diagnosed with left lower lobe lung mass with metastasis to the liver, s/p biopsy on 09/05/14.  He was admitted 09/08/14 with worsening shortness of breath and weakness.    Patient is taking apixaban (eliquis) for chronic Afib.  Given ARF and compromised hepatic function, CCM has consulted Pharmacy to transition to IV heparin.   Last dose of apixaban documented 8/26 at 23:25. 09/11/14  Heparin  level = 0.73 (supratherapeutic)  Hgb slightly decreased to 10.6, Plts stable  No complications of therapy noted  1624 HL=0.69, reduce infusion to 1100 Today, 09/12/14  0250 HL=0.55, no problems reported per RN   Goal of Therapy:  Heparin level 0.3-0.7 units/ml  Monitor platelets by anticoagulation protocol: Yes   Plan:   Continue heparin drip @ 1100 units/hr  Recheck HL at 11am today  Check daily heparin level  Check daily CBC  Dorrene German 09/12/2014, 3:34 AM

## 2014-09-13 ENCOUNTER — Ambulatory Visit
Admit: 2014-09-13 | Discharge: 2014-09-13 | Disposition: A | Discharge status: Home / Self Care | Payer: Commercial Managed Care - HMO | Attending: Radiation Oncology | Admitting: Radiation Oncology

## 2014-09-13 ENCOUNTER — Ambulatory Visit: Payer: Commercial Managed Care - HMO

## 2014-09-13 ENCOUNTER — Encounter: Payer: Self-pay | Admitting: *Deleted

## 2014-09-13 DIAGNOSIS — Z7189 Other specified counseling: Secondary | ICD-10-CM

## 2014-09-13 DIAGNOSIS — C3432 Malignant neoplasm of lower lobe, left bronchus or lung: Secondary | ICD-10-CM

## 2014-09-13 DIAGNOSIS — R0600 Dyspnea, unspecified: Secondary | ICD-10-CM

## 2014-09-13 DIAGNOSIS — N178 Other acute kidney failure: Secondary | ICD-10-CM

## 2014-09-13 LAB — BASIC METABOLIC PANEL
Anion gap: 27 — ABNORMAL HIGH (ref 5–15)
BUN: 118 mg/dL — AB (ref 6–20)
CALCIUM: 9.4 mg/dL (ref 8.9–10.3)
CO2: 12 mmol/L — ABNORMAL LOW (ref 22–32)
CREATININE: 2.41 mg/dL — AB (ref 0.61–1.24)
Chloride: 97 mmol/L — ABNORMAL LOW (ref 101–111)
GFR calc Af Amer: 28 mL/min — ABNORMAL LOW (ref >60)
GFR, EST NON AFRICAN AMERICAN: 24 mL/min — AB (ref >60)
Glucose, Bld: 77 mg/dL (ref 65–99)
POTASSIUM: 4.9 mmol/L (ref 3.5–5.1)
SODIUM: 136 mmol/L (ref 135–145)

## 2014-09-13 LAB — CULTURE, BLOOD (ROUTINE X 2)
CULTURE: NO GROWTH
Culture: NO GROWTH

## 2014-09-13 LAB — CBC
HCT: 30.7 % — ABNORMAL LOW (ref 39.0–52.0)
Hemoglobin: 10.2 g/dL — ABNORMAL LOW (ref 13.0–17.0)
MCH: 23.9 pg — ABNORMAL LOW (ref 26.0–34.0)
MCHC: 33.2 g/dL (ref 30.0–36.0)
MCV: 71.9 fL — AB (ref 78.0–100.0)
PLATELETS: 224 10*3/uL (ref 150–400)
RBC: 4.27 MIL/uL (ref 4.22–5.81)
RDW: 17.7 % — ABNORMAL HIGH (ref 11.5–15.5)
WBC: 21.8 10*3/uL — AB (ref 4.0–10.5)

## 2014-09-13 LAB — GLUCOSE, CAPILLARY
GLUCOSE-CAPILLARY: 66 mg/dL (ref 65–99)
GLUCOSE-CAPILLARY: 104 mg/dL — AB (ref 65–99)
Glucose-Capillary: 96 mg/dL (ref 65–99)
Glucose-Capillary: 105 mg/dL — ABNORMAL HIGH (ref 65–99)

## 2014-09-13 LAB — HEPARIN LEVEL (UNFRACTIONATED): Heparin Unfractionated: 0.46 IU/mL (ref 0.30–0.70)

## 2014-09-13 MED ORDER — ENSURE ENLIVE PO LIQD
237.0000 mL | Freq: Two times a day (BID) | ORAL | Status: DC
  Administered 2014-09-13 – 2014-09-18 (×8): 237 mL via ORAL

## 2014-09-13 MED ORDER — SODIUM BICARBONATE 650 MG PO TABS
650.0000 mg | ORAL_TABLET | Freq: Three times a day (TID) | ORAL | Status: DC
  Administered 2014-09-13 – 2014-09-14 (×3): 650 mg via ORAL
  Filled 2014-09-13 (×4): qty 1

## 2014-09-13 MED ORDER — ALBUTEROL SULFATE (2.5 MG/3ML) 0.083% IN NEBU
2.5000 mg | INHALATION_SOLUTION | Freq: Four times a day (QID) | RESPIRATORY_TRACT | Status: DC
Start: 2014-09-13 — End: 2014-09-13

## 2014-09-13 MED ORDER — WARFARIN SODIUM 3 MG PO TABS
3.0000 mg | ORAL_TABLET | Freq: Once | ORAL | Status: AC
  Administered 2014-09-13: 3 mg via ORAL
  Filled 2014-09-13: qty 1

## 2014-09-13 MED ORDER — IPRATROPIUM-ALBUTEROL 0.5-2.5 (3) MG/3ML IN SOLN
3.0000 mL | Freq: Three times a day (TID) | RESPIRATORY_TRACT | Status: DC
  Administered 2014-09-14 – 2014-09-16 (×5): 3 mL via RESPIRATORY_TRACT
  Filled 2014-09-13 (×6): qty 3

## 2014-09-13 MED ORDER — WARFARIN - PHARMACIST DOSING INPATIENT: Freq: Every day | Status: DC

## 2014-09-13 NOTE — Progress Notes (Signed)
ANTICOAGULATION CONSULT NOTE -Follow Up Consult  Pharmacy Consult for Heparin Indication: atrial fibrillation  Allergies  Allergen Reactions  . Amoxicillin Itching and Rash    Patient Measurements: Height: '5\' 9"'$  (175.3 cm) Weight: 215 lb 9.8 oz (97.8 kg) IBW/kg (Calculated) : 70.7 Heparin Dosing Weight: 89.3 kg  Vital Signs: Temp: 97.9 F (36.6 C) (08/31 0602) Temp Source: Oral (08/31 0602) BP: 108/69 mmHg (08/31 0602) Pulse Rate: 85 (08/31 0602)  Labs:  Recent Labs  09/10/14 1432  09/11/14 0500  09/12/14 0250 09/12/14 1112 09/13/14 0517  HGB  --   < > 10.6*  --  10.5*  --  10.2*  HCT  --   --  32.9*  --  32.3*  --  30.7*  PLT  --   --  232  --  204  --  224  APTT 97*  --   --   --   --   --   --   HEPARINUNFRC  --   --  0.73*  < > 0.55 0.50 0.46  CREATININE  --   --  2.23*  --   --   --   --   < > = values in this interval not displayed.  Estimated Creatinine Clearance: 30.5 mL/min (by C-G formula based on Cr of 2.23).   Medical History: Past Medical History  Diagnosis Date  . High blood pressure   . Irregular heart rhythm   . Asthma   . Diabetes mellitus   . High cholesterol   . Kidney disease   . Sleep apnea   . Sickle cell trait   . A-fib   . Complete heart block   . Pacemaker     Medications:  Scheduled:  . carvedilol  12.5 mg Oral BID WC  . furosemide  40 mg Intravenous Q12H  . guaiFENesin  600 mg Oral BID  . insulin aspart  0-5 Units Subcutaneous QHS  . insulin aspart  0-9 Units Subcutaneous TID WC  . ipratropium-albuterol  3 mL Nebulization QID  . levofloxacin  750 mg Oral Q48H  . polyethylene glycol  17 g Oral Daily   Infusions:  . heparin 1,100 Units/hr (09/12/14 2205)    Assessment: 81 year old male recently diagnosed with left lower lobe lung mass with metastasis to the liver, s/p biopsy on 09/05/14.  He was admitted 09/08/14 with worsening shortness of breath and weakness.    Patient was taking apixaban (eliquis) for chronic  Afib.  Given ARF and compromised hepatic function, CCM has consulted Pharmacy to transition to IV heparin.   09/13/2014 HL= 0.46 therapeutic but trending down H/H stable but trending down Plts WNL  Goal of Therapy:  Heparin level 0.3-0.7 units/ml  Monitor platelets by anticoagulation protocol: Yes   Plan:   Continue heparin drip @ 1100 units/hr  Check daily heparin level  Check daily CBC  Dolly Rias RPh 09/13/2014, 7:48 AM Pager [phone]

## 2014-09-13 NOTE — Progress Notes (Signed)
Pt refused cpap.  Pt was advised that RT is available all night should he change his mind.

## 2014-09-13 NOTE — Progress Notes (Signed)
  Radiation Oncology         [phone] ________________________________  Name: Bradley Rasmussen MRN: [id number]  Date: 09/13/2014  DOB: 06-06-1931  Simulation Verification Note    ICD-9-CM ICD-10-CM   1. Malignant neoplasm of lower lobe of left lung 162.5 C34.32     Status: inpatient  NARRATIVE: The patient was brought to the treatment unit and placed in the planned treatment position. The clinical setup was verified. Then port films were obtained and uploaded to the radiation oncology medical record software.  The treatment beams were carefully compared against the planned radiation fields. The position location and shape of the radiation fields was reviewed. They targeted volume of tissue appears to be appropriately covered by the radiation beams. Organs at risk appear to be excluded as planned.  Based on my personal review, I approved the simulation verification. The patient's treatment will proceed as planned.  -----------------------------------  Blair Promise, PhD, MD

## 2014-09-13 NOTE — Progress Notes (Signed)
Daily Progress Note   Patient Name: Bradley Rasmussen       Date: 09/13/2014 DOB: 11-06-1931  Age: 81 y.o. MRN#: [id number] Attending Physician: Elmarie Shiley, MD Primary Care Physician: Marco Collie, MD Admit Date: 09/08/2014  Reason for Consultation/Follow-up: Establishing goals of care  Subjective: Patient is a 81 year old gentleman with a past medical history significant for diabetes hypertension asthma and history of smoking. Patient was recently found to have a lung mass and liver metastases. Patient has been seen and evaluated by oncology. Patient has also been seen by pulmonary critical care medicine. At this time, working diagnosis is metastatic lung cancer as his liver biopsy shows metastatic carcinoma and he has left lower lobe consolidation secondary to bronchial obstruction. Patient also has bony metastases and significant hilar or mediastinal adenopathy. He has a pacemaker. It is noted from oncology notes that it has been discussed with the patient and family that his cancer is incurable at this stage, this is a new diagnosis of cancer for this patient. It is deemed to be aggressive in nature. Palliative radiation has been recommended for symptom management to improve his dyspnea.   Interval Events: Met with Bradley Rasmussen and his wife.  They report that he is scheduled to go for his first radiation treatment today.  They voice understanding that the purpose of his treatment is not curative in nature.  They report being pleased with his care to this point in time, and he is feeling better and has found this hospitalization to be helpful.  We discussed that the hospital can be useful as long as he is getting well enough from care he receives at the hospital to enjoy his time at home, but there is going to come a time in the near future where, if his goal is to be at home, he may be better served to plan on being at home and bringing care to him at home rather repeated trips to the hospital.  They  expressed understanding of this and welcomed a visit tomorrow to continue conversation and see how he is tolerating radiation therapy.  Length of Stay: 5 days  Current Medications: Scheduled Meds:  . carvedilol  12.5 mg Oral BID WC  . feeding supplement (ENSURE ENLIVE)  237 mL Oral BID BM  . furosemide  40 mg Intravenous Q12H  . guaiFENesin  600 mg Oral BID  . insulin aspart  0-5 Units Subcutaneous QHS  . insulin aspart  0-9 Units Subcutaneous TID WC  . ipratropium-albuterol  3 mL Nebulization QID  . levofloxacin  750 mg Oral Q48H  . polyethylene glycol  17 g Oral Daily  . sodium bicarbonate  650 mg Oral TID  . [START ON 09/14/2014] Warfarin - Pharmacist Dosing Inpatient   Does not apply q1800    Continuous Infusions: . heparin 1,100 Units/hr (09/12/14 2205)    PRN Meds: albuterol, chlorpheniramine-HYDROcodone, docusate sodium, oxyCODONE, technetium albumin aggregated, technetium TC 20M diethylenetriame-pentaacetic acid  Palliative Performance Scale: 40%     Vital Signs: BP 115/62 mmHg  Pulse 80  Temp(Src) 98 F (36.7 C) (Oral)  Resp 18  Ht '5\' 9"'  (1.753 m)  Wt 97.8 kg (215 lb 9.8 oz)  BMI 31.83 kg/m2  SpO2 95% SpO2: SpO2: 95 % O2 Device: O2 Device: Nasal Cannula O2 Flow Rate: O2 Flow Rate (L/min): 1 L/min  Intake/output summary:  Intake/Output Summary (Last 24 hours) at 09/13/14 1837 Last data filed at 09/13/14 1333  Gross per 24 hour  Intake  360 ml  Output   2000 ml  Net  -1640 ml   LBM:   Baseline Weight: Weight: 91.1 kg (200 lb 13.4 oz) Most recent weight: Weight: 97.8 kg (215 lb 9.8 oz)  Physical Exam: General: Alert, awake, in no acute distress.  HEENT: No bruits, no goiter, no JVD Heart: Regular rate and rhythm. No murmur appreciated. Lungs: Diminished air movement bilaterally Abdomen: Soft, nontender, nondistended, positive bowel sounds.  Ext: LE edema Skin: Warm and dry Neuro: Grossly intact, nonfocal.              Additional Data  Reviewed: Recent Labs     09/11/14  0500  09/12/14  0250  09/13/14  0517  WBC  22.4*  21.3*  21.8*  HGB  10.6*  10.5*  10.2*  PLT  232  204  224  NA  136   --   136  BUN  80*   --   118*  CREATININE  2.23*   --   2.41*     Problem List:  Patient Active Problem List   Diagnosis Date Noted  . Metastatic lung cancer (metastasis from lung to other site)   . Malnutrition of moderate degree 09/10/2014  . Encounter for palliative care   . Dyspnea   . Metastatic carcinoma involving liver with unknown primary site 09/09/2014  . Metastatic primary lung cancer 09/09/2014  . Malignant neoplasm of lower lobe of left lung   . Dehydration 09/08/2014  . Acute renal failure 09/08/2014  . Hyperkalemia 09/08/2014  . Hypokalemia 09/08/2014  . Lactic acidosis 09/08/2014  . Systolic CHF, chronic 97/67/3419  . Liver mass   . Lung mass 08/29/2014  . Abdominal pain 08/29/2014  . OSA on CPAP 03/23/2013  . Shortness of breath 01/31/2013  . CHF (congestive heart failure) 01/31/2013  . Anemia 01/31/2013     Palliative Care Assessment & Plan    Code Status:  Full code  Goals of Care:  Patient reports that spending time with family is what is most important to him.  He is invested in seeing how he does with radiation, and reports being hopeful that he may be able to get further treatment (such as chemo) after this.  We did not get into long discussion of specific goals he has for any future treatments today, but I did introduce that we should ensure that we have goals in mind on what he is trying to accomplish with any treatments that he considers. Plan to f/u further tomorrow.  Symptom Management:  No acute symptoms currently, continue to monitor  Psycho-social/Spiritual:  Desire for further Chaplaincy support:no   Prognosis: < 6 months Discharge Planning: to be determined   Care plan was discussed with patient and his wife  Thank you for allowing the Palliative Medicine Team to  assist in the care of this patient.   Time In: 9:30 Time Out: 10:00 Total Time 30 Prolonged Time Billed  no     Greater than 50%  of this time was spent counseling and coordinating care related to the above assessment and plan.   Micheline Rough, MD  09/13/2014, 6:37 PM  Please contact Palliative Medicine Team phone at [phone] for questions and concerns.

## 2014-09-13 NOTE — Progress Notes (Addendum)
ANTICOAGULATION CONSULT NOTE -Follow Up Consult  Pharmacy Consult for Heparin-->Coumadin Indication: atrial fibrillation  Allergies  Allergen Reactions  . Amoxicillin Itching and Rash    Patient Measurements: Height: '5\' 9"'$  (175.3 cm) Weight: 215 lb 9.8 oz (97.8 kg) IBW/kg (Calculated) : 70.7 Heparin Dosing Weight: 89.3 kg  Vital Signs: Temp: 98 F (36.7 C) (08/31 1421) Temp Source: Oral (08/31 1421) BP: 110/64 mmHg (08/31 1421) Pulse Rate: 85 (08/31 1421)  Labs:  Recent Labs  09/11/14 0500  09/12/14 0250 09/12/14 1112 09/13/14 0517  HGB 10.6*  --  10.5*  --  10.2*  HCT 32.9*  --  32.3*  --  30.7*  PLT 232  --  204  --  224  HEPARINUNFRC 0.73*  < > 0.55 0.50 0.46  CREATININE 2.23*  --   --   --  2.41*  < > = values in this interval not displayed.  Estimated Creatinine Clearance: 28.2 mL/min (by C-G formula based on Cr of 2.41).   Medical History: Past Medical History  Diagnosis Date  . High blood pressure   . Irregular heart rhythm   . Asthma   . Diabetes mellitus   . High cholesterol   . Kidney disease   . Sleep apnea   . Sickle cell trait   . A-fib   . Complete heart block   . Pacemaker     Medications:  Scheduled:  . carvedilol  12.5 mg Oral BID WC  . feeding supplement (ENSURE ENLIVE)  237 mL Oral BID BM  . furosemide  40 mg Intravenous Q12H  . guaiFENesin  600 mg Oral BID  . insulin aspart  0-5 Units Subcutaneous QHS  . insulin aspart  0-9 Units Subcutaneous TID WC  . ipratropium-albuterol  3 mL Nebulization QID  . levofloxacin  750 mg Oral Q48H  . polyethylene glycol  17 g Oral Daily  . sodium bicarbonate  650 mg Oral TID   Infusions:  . heparin 1,100 Units/hr (09/12/14 2205)    Assessment: 81 year old male recently diagnosed with left lower lobe lung mass with metastasis to the liver, s/p biopsy on 09/05/14.  He was admitted 09/08/14 with worsening shortness of breath and weakness.    Patient was taking apixaban (eliquis) for chronic  Afib.  Given ARF and compromised hepatic function, CCM has consulted Pharmacy to transition to IV heparin.  Now starting Coumadin. Patient was on Coumadin '1mg'$  ~ 58yrago prior to switching to Eliquis.  09/13/2014  INR at baseline HL therapeutic this morning H/H stable but trending down Plts WNL No overt bleeding noted Drug Interaction= Levaquin  Goal of Therapy:  Heparin level 0.3-0.7 units/ml  Monitor platelets by anticoagulation protocol: Yes  INR 2-3   Plan:   Continue heparin drip @ 1100 units/hr  Check daily heparin level & CBC while on heparin  Start Coumadin '3mg'$  po x1  Daily INR  F/U Coumadin education  MNetta Cedars PharmD, BCPS Pager: [phone] 39718/31/2016, 4:45 PM

## 2014-09-13 NOTE — Progress Notes (Signed)
PROGRESS NOTE  Bradley Rasmussen DQQ:[id number] DOB: November 28, 1931 DOA: 09/08/2014 PCP: Marco Collie, MD  HPI/Recap of past 46 hours: 81 year old male with past mental history of recently diagnosed metastatic lung cancer admitted on the evening of 8/26 with progressively worsening dyspnea on exertion and acute renal failure. Initially thought to be in septic shock from postobstructive pneumonia. Seen by critical care.  Critical care clarified that his lactic acidosis was not from sepsis, but from his liver metastases. Patient's breathing significantly improved over the initial 24 hours, and he was transferred to the floor.  Over the past few days, breathing appeared more labored.swallow eval checked and found to be unrevealing.  IVs stopped and patient started on diuretic.  Has started radiation therapy for palliative options. Pulmonary discontinued steroids.  Still with SOB.    Assessment/Plan:  Shortness of breath/dyspnea on exertion: Workup in progress. Likely from Postobstructive lung mass  plus underlying COPD from smoking. Continue nebulizers, has stopped IV fluids and IV antibiotics. Started gentle diuresis. Steroids stopped Appreciate pulmonary assistance. On Levaquin.  Repeat chest x ray in am.   Acute kidney injury in the setting of stage III chronic kidney disease: Likely from hypotension from dehydration. Start sodium bicarb for metabolic acidosis. Monitor on IV laisx.    Hyperkalemia: Noted on admission.status post Kayexalate. Now resolved.  Lactic acidosis: Not sepsis. Lactic acidosis secondary liver metastases  Chronic systolic CHF. Monitoring daily weights, significant increased today. On IV lasix. Repeat renal function in am. Check chest xray in am.   Metastatic lung carcinoma involving liver with unknown primary site: Oncology seeing.  Palliative radiation recommended. Radiation oncology to see. Palliative care consulted.   Atrial fibrillation: Chads 2 vas score of 5.  Initially on eliquis, but with liver metastases and renal dysfunction, changed to IV heparin.  Will need to follow renal function. Start coumadin.   diabetes mellitus controlled with renal disease: Normally on Glucotrol. A1c only at 5.6.   Code Status: Full code  Family Communication: wife at the bedside   Disposition Plan: Continue in-hospital until radiation therapy set, breathing felt to be stabilized. Anticipate discharge in next 1-2 days   Consultants:  Oncology  Critical care  Procedures:  None  Antibiotics:  Aztreonam 8/26-8/27  Levaquin 8/26-present (changed to by mouth on 8/29)  Vancomycin 8/26-8/27   Objective: BP 110/64 mmHg  Pulse 80  Temp(Src) 97.9 F (36.6 C) (Oral)  Resp 16  Ht '5\' 9"'$  (1.753 m)  Wt 97.8 kg (215 lb 9.8 oz)  BMI 31.83 kg/m2  SpO2 98%  Intake/Output Summary (Last 24 hours) at 09/13/14 1406 Last data filed at 09/13/14 1333  Gross per 24 hour  Intake    240 ml  Output   2000 ml  Net  -1760 ml   Filed Weights   09/10/14 0500 09/12/14 0551 09/13/14 0602  Weight: 96.6 kg (212 lb 15.4 oz) 99.5 kg (219 lb 5.7 oz) 97.8 kg (215 lb 9.8 oz)    Exam:   General:  Alert and oriented 3, no acute distress  Cardiovascular: Regular rate and rhythm, S1-S2  Respiratory:  decreased breath sounds throughout, wheezing resolved  Abdomen: Soft, distended, and nontender, positive bowel sounds  Musculoskeletal: No clubbing or cyanosis, trace edema   Data Reviewed: Basic Metabolic Panel:  Recent Labs Lab 09/09/14 0400 09/09/14 1220 09/09/14 2022 09/10/14 0524 09/11/14 0500  NA 134* 136 137 137 136  K 5.4* 4.6 4.8 4.0 4.0  CL 94* 97* 96* 97* 97*  CO2 17* 17* 12* 13*  12*  GLUCOSE 83 97 154* 128* 118*  BUN 77* 76* 74* 75* 80*  CREATININE 2.13* 2.17* 2.33* 2.31* 2.23*  CALCIUM 9.8 9.8 9.5 9.4 9.4   Liver Function Tests:  Recent Labs Lab 09/08/14 1604 09/09/14 0400  AST 115* 110*  ALT 47 <5*  ALKPHOS 490* 474*  BILITOT 1.0  1.3*  PROT 6.7 6.2*  ALBUMIN 2.5* 2.4*   No results for input(s): LIPASE, AMYLASE in the last 168 hours. No results for input(s): AMMONIA in the last 168 hours. CBC:  Recent Labs Lab 09/08/14 1604 09/10/14 0524 09/11/14 0500 09/12/14 0250 09/13/14 0517  WBC 14.0* 19.7* 22.4* 21.3* 21.8*  NEUTROABS 12.0*  --   --   --   --   HGB 11.8* 10.8* 10.6* 10.5* 10.2*  HCT 36.4* 33.3* 32.9* 32.3* 30.7*  MCV 73.4* 74.3* 73.9* 73.1* 71.9*  PLT 184 230 232 204 224   Cardiac Enzymes:    Recent Labs Lab 09/08/14 1604 09/08/14 2200 09/09/14 0400 09/09/14 1220  CKTOTAL 24*  --   --   --   TROPONINI 0.03 0.04* 0.03 0.03   BNP (last 3 results) No results for input(s): BNP in the last 8760 hours.  ProBNP (last 3 results) No results for input(s): PROBNP in the last 8760 hours.  CBG:  Recent Labs Lab 09/12/14 1159 09/12/14 1720 09/12/14 2106 09/13/14 0752 09/13/14 1137  GLUCAP 121* 124* 101* 66 104*    Recent Results (from the past 240 hour(s))  Culture, blood (routine x 2)     Status: None   Collection Time: 09/08/14  4:27 PM  Result Value Ref Range Status   Specimen Description BLOOD RIGHT ANTECUBITAL  Final   Special Requests BOTTLES DRAWN AEROBIC AND ANAEROBIC 5 CC EA  Final   Culture   Final    NO GROWTH 5 DAYS Performed at [facility]    Report Status 09/13/2014 FINAL  Final  Culture, blood (routine x 2)     Status: None   Collection Time: 09/08/14  4:27 PM  Result Value Ref Range Status   Specimen Description BLOOD RIGHT HAND  Final   Special Requests BOTTLES DRAWN AEROBIC AND ANAEROBIC 5 CC EA  Final   Culture   Final    NO GROWTH 5 DAYS Performed at [facility]    Report Status 09/13/2014 FINAL  Final  Urine culture     Status: None   Collection Time: 09/08/14  5:39 PM  Result Value Ref Range Status   Specimen Description URINE, CLEAN CATCH  Final   Special Requests Normal  Final   Culture   Final    8,000 COLONIES/mL INSIGNIFICANT  GROWTH Performed at [facility]    Report Status 09/10/2014 FINAL  Final  MRSA PCR Screening     Status: None   Collection Time: 09/08/14  9:02 PM  Result Value Ref Range Status   MRSA by PCR NEGATIVE NEGATIVE Final    Comment:        The GeneXpert MRSA Assay (FDA approved for NASAL specimens only), is one component of a comprehensive MRSA colonization surveillance program. It is not intended to diagnose MRSA infection nor to guide or monitor treatment for MRSA infections.      Studies: No results found.  Scheduled Meds: . carvedilol  12.5 mg Oral BID WC  . feeding supplement (ENSURE ENLIVE)  237 mL Oral BID BM  . furosemide  40 mg Intravenous Q12H  . guaiFENesin  600 mg  Oral BID  . insulin aspart  0-5 Units Subcutaneous QHS  . insulin aspart  0-9 Units Subcutaneous TID WC  . ipratropium-albuterol  3 mL Nebulization QID  . levofloxacin  750 mg Oral Q48H  . polyethylene glycol  17 g Oral Daily    Continuous Infusions: . heparin 1,100 Units/hr (09/12/14 2205)     Time spent: 25 minutes  Giovanne Nickolson, [facility] Pager [phone]. If 7PM-7AM, please contact night-coverage at [email or web address], password [facility] 09/13/2014, 2:06 PM  LOS: 5 days

## 2014-09-13 NOTE — Consult Note (Addendum)
   [facility] Inpatient Consult   09/13/2014  Montrae Braithwaite [birth year] [id number]   Patient evaluated for [facility] Management services. Went to bedside to speak with patient. However, he was off the unit. Will come back at later time. Inpatient RNCM aware that attempts are being made to engage patient for possible [facility] Care Management services.  Addendum: Received notification from inpatient RNCM that Mr. Thammavong was back in his room. Went to bedside to speak with Mr. and Mrs. Malinoski about [place] Management services. Consents obtained. Mrs. Smock states patient is a little weak and she inquired about someone coming to the house for therapy when he gets home. Explained to Mrs. Seidman the difference between home health and [place] [facility]. Mrs. Riegler also states " I have been helping him to the bathroom and he is still a little weak". Reinforced that [facility] [facility] will not interfere or replace services provided by home health. Discussed with inpatient RNCM that patient will likely benefit from home health services at discharge and could benefit from PT eval while inpatient. Will continue to follow.   Confirmed Primary Care MD is Dr. Marco Collie. Confirmed best contact number as [phone]. Left [facility] Care Management packet at bedside.    Marthenia Rolling, MSN-Ed, RN,BSN [facility] Liaison [phone]

## 2014-09-13 NOTE — Progress Notes (Signed)
Nutrition Follow-up  DOCUMENTATION CODES:   Non-severe (moderate) malnutrition in context of chronic illness  INTERVENTION:  - Continue to encourage PO intake - Will order Ensure Enlive BID, each supplement provides 350 kcal and 20 grams of protein - Will order Magic cup once/day, each supplement provides 290 kcal and 9 grams of protein  - RD will continue to monitor for needs  NUTRITION DIAGNOSIS:   Swallowing difficulty related to dysphagia as evidenced by per patient/family report. -ongoing  GOAL:   Patient will meet greater than or equal to 90% of their needs -variably met  MONITOR:   PO intake, Labs, Weight trends, Skin, I & O's  ASSESSMENT:   81 year old male with past mental history of recently diagnosed metastatic lung cancer admitted on the evening of 8/26 with progressively worsening dyspnea on exertion and acute renal failure. Initially thought to be in septic shock from postobstructive pneumonia. Seen by critical care. Critical care clarified that his lactic acidosis was not from sepsis, but from his liver metastases.   8/31 Pt ate 90% breakfast and 100% lunch and dinner 8/28 and 25% of breakfast yesterday (8/30). Pt's wife reports that he has not been eating much the past few days. Pt reports difficulty chewing and swallowing during meals due to SOB. He reports doing better with fluids than solid foods. Per notes, pt has mainly been refusing CPAP.   Talked with pt about available supplements and he is interested in [employer] and [employer]; RD will order.   Pt's family reports that Dysphagia 3 diet order was to be placed but that it has not been; when asked family indicates that they are interested in having this order placed as it may help with intakes.  Pt denies abdominal pain or nausea with intakes or at rest. Pt to go for first radiation treatment today.  Not meeting needs on average. Medications reviewed. Labs reviewed; CBGs: 101-144 mg/dL, Cl: 97 mmol/L,  BUN/creatinine elevated, GFR: 26.    8/28 - Per pt and family, pt has had poor appetite x 3 weeks PTA.  - Appetite has improved and is now eating 90%.  - Pt does report difficulty swallowing for months now.  - Pt has difficulty with liquids. SLP eval has been ordered. - Per weight history, pt has had recent weight gain. Pt with history of CHF.   Diet Order:  Diet heart healthy/carb modified Room service appropriate?: Yes with Assist; Fluid consistency:: Thin  Skin:  Reviewed, no issues  Last BM:  8/31  Height:   Ht Readings from Last 1 Encounters:  09/08/14 5' 9" (1.753 m)    Weight:   Wt Readings from Last 1 Encounters:  09/13/14 215 lb 9.8 oz (97.8 kg)    Ideal Body Weight:  72.7 kg  BMI:  Body mass index is 31.83 kg/(m^2).  Estimated Nutritional Needs:   Kcal:  2100-2300  Protein:  115-125g  Fluid:  2.1L/day  EDUCATION NEEDS:   Education needs addressed     Jarome Matin, RD, LDN Inpatient Clinical Dietitian Pager # [phone] After hours/weekend pager # [phone]

## 2014-09-13 NOTE — Progress Notes (Signed)
AM Blood Sugar 66. Pt awake and alert. No complaints. Pt requested Cranberry Juice instead of Orange Juice and breakfast tray has arrived.

## 2014-09-14 ENCOUNTER — Inpatient Hospital Stay (HOSPITAL_COMMUNITY): Payer: Commercial Managed Care - HMO

## 2014-09-14 ENCOUNTER — Ambulatory Visit
Admit: 2014-09-14 | Discharge: 2014-09-14 | Disposition: A | Discharge status: Home / Self Care | Payer: Commercial Managed Care - HMO | Attending: Radiation Oncology | Admitting: Radiation Oncology

## 2014-09-14 LAB — BASIC METABOLIC PANEL
ANION GAP: 27 — AB (ref 5–15)
BUN: 115 mg/dL — AB (ref 6–20)
CO2: 14 mmol/L — ABNORMAL LOW (ref 22–32)
Calcium: 9.4 mg/dL (ref 8.9–10.3)
Chloride: 92 mmol/L — ABNORMAL LOW (ref 101–111)
Creatinine, Ser: 2.43 mg/dL — ABNORMAL HIGH (ref 0.61–1.24)
GFR, EST AFRICAN AMERICAN: 27 mL/min — AB (ref >60)
GFR, EST NON AFRICAN AMERICAN: 24 mL/min — AB (ref >60)
Glucose, Bld: 76 mg/dL (ref 65–99)
POTASSIUM: 4.2 mmol/L (ref 3.5–5.1)
SODIUM: 133 mmol/L — AB (ref 135–145)

## 2014-09-14 LAB — GLUCOSE, CAPILLARY
GLUCOSE-CAPILLARY: 86 mg/dL (ref 65–99)
GLUCOSE-CAPILLARY: 122 mg/dL — AB (ref 65–99)
GLUCOSE-CAPILLARY: 165 mg/dL — AB (ref 65–99)
GLUCOSE-CAPILLARY: 119 mg/dL — AB (ref 65–99)

## 2014-09-14 LAB — PROTIME-INR
INR: 1.17 (ref 0.00–1.49)
PROTHROMBIN TIME: 15.1 s (ref 11.6–15.2)

## 2014-09-14 LAB — CBC
HCT: 31 % — ABNORMAL LOW (ref 39.0–52.0)
HEMOGLOBIN: 10.4 g/dL — AB (ref 13.0–17.0)
MCH: 24.1 pg — ABNORMAL LOW (ref 26.0–34.0)
MCHC: 33.5 g/dL (ref 30.0–36.0)
MCV: 71.8 fL — ABNORMAL LOW (ref 78.0–100.0)
Platelets: 214 10*3/uL (ref 150–400)
RBC: 4.32 MIL/uL (ref 4.22–5.81)
RDW: 17.6 % — ABNORMAL HIGH (ref 11.5–15.5)
WBC: 17.4 10*3/uL — AB (ref 4.0–10.5)

## 2014-09-14 LAB — HEPARIN LEVEL (UNFRACTIONATED): HEPARIN UNFRACTIONATED: 0.4 [IU]/mL (ref 0.30–0.70)

## 2014-09-14 LAB — AMMONIA: AMMONIA: 83 umol/L — AB (ref 9–35)

## 2014-09-14 MED ORDER — SODIUM BICARBONATE 650 MG PO TABS
1300.0000 mg | ORAL_TABLET | Freq: Three times a day (TID) | ORAL | Status: DC
  Administered 2014-09-14 – 2014-09-18 (×12): 1300 mg via ORAL
  Filled 2014-09-14 (×13): qty 2

## 2014-09-14 MED ORDER — FUROSEMIDE 40 MG PO TABS
40.0000 mg | ORAL_TABLET | Freq: Two times a day (BID) | ORAL | Status: DC
  Administered 2014-09-14 – 2014-09-18 (×8): 40 mg via ORAL
  Filled 2014-09-14 (×8): qty 1

## 2014-09-14 MED ORDER — WARFARIN SODIUM 3 MG PO TABS
3.0000 mg | ORAL_TABLET | Freq: Once | ORAL | Status: AC
  Administered 2014-09-14: 3 mg via ORAL
  Filled 2014-09-14: qty 1

## 2014-09-14 NOTE — Progress Notes (Signed)
PROGRESS NOTE  Bradley Rasmussen XFG:[id number] DOB: [birth year] DOA: 09/08/2014 PCP: Marco Collie, MD  HPI/Recap of past 28 hours: 81 year old male with past mental history of recently diagnosed metastatic lung cancer admitted on the evening of 8/26 with progressively worsening dyspnea on exertion and acute renal failure. Initially thought to be in septic shock from postobstructive pneumonia. Seen by critical care.  Critical care clarified that his lactic acidosis was not from sepsis, but from his liver metastases. Patient's breathing significantly improved over the initial 24 hours, and he was transferred to the floor.  Over the past few days, breathing appeared more labored.swallow eval checked and found to be unrevealing.  IVs stopped and patient started on diuretic.  Has started radiation therapy for palliative options. Pulmonary discontinued steroids.  Still with SOB. No worse than yesterday. Weight trending down. Chest x ray with no evidence of pulmonary edema.    Assessment/Plan:  Acute Hypoxic Respiratory Failure:  Shortness of breath/dyspnea on exertion:  Likely from Postobstructive lung mass  plus underlying COPD from smoking. Continue nebulizers.   Steroids stopped Appreciate pulmonary assistance. On Levaquin.  Repeat chest x ray in am with persistent lung mass. No pulmonary edema/  Will change lasix to oral.  further discussion with palliative care team.   Acute kidney injury in the setting of stage III chronic kidney disease: Likely from hypotension from dehydration initially. Start sodium bicarb for metabolic acidosis.  Change lasix to oral. Good urine out put.    Hyperkalemia: Noted on admission.status post Kayexalate. Now resolved.  Lactic acidosis: Not sepsis. Lactic acidosis secondary liver metastases  Chronic systolic CHF. Monitoring daily weights, significant increased today. chest xray 9-01 no pulmonary edema. Change lasix to oral. Weight trending down.  99--97--96  Metastatic lung carcinoma involving liver with unknown primary site: Oncology seeing.  Palliative radiation recommended. Radiation oncology to see. Palliative care consulted.   Atrial fibrillation: Chads 2 vas score of 5. Initially on eliquis, but with liver metastases and renal dysfunction, changed to IV heparin.  Will need to follow renal function. Started  coumadin.   diabetes mellitus controlled with renal disease: Normally on Glucotrol. A1c only at 5.6.   Code Status: Full code  Family Communication: wife at the bedside   Disposition Plan: Continue in-hospital until radiation therapy set, breathing felt to be stabilized. Anticipate discharge in next 1-2 days. Needs PT evaluation.    Consultants:  Oncology  Critical care  Procedures:  None  Antibiotics:  Aztreonam 8/26-8/27  Levaquin 8/26-present (changed to by mouth on 8/29)  Vancomycin 8/26-8/27   Objective: BP 114/66 mmHg  Pulse 85  Temp(Src) 97.9 F (36.6 C) (Oral)  Resp 20  Ht '5\' 9"'$  (1.753 m)  Wt 96.9 kg (213 lb 10 oz)  BMI 31.53 kg/m2  SpO2 96%  Intake/Output Summary (Last 24 hours) at 09/14/14 1520 Last data filed at 09/14/14 0905  Gross per 24 hour  Intake    240 ml  Output   3450 ml  Net  -3210 ml   Filed Weights   09/12/14 0551 09/13/14 0602 09/14/14 0500  Weight: 99.5 kg (219 lb 5.7 oz) 97.8 kg (215 lb 9.8 oz) 96.9 kg (213 lb 10 oz)    Exam:   General:  Alert and oriented 3, no acute distress  Cardiovascular: Regular rate and rhythm, S1-S2  Respiratory:  decreased breath sounds throughout, wheezing resolved  Abdomen: Soft, distended, and nontender, positive bowel sounds  Musculoskeletal: No clubbing or cyanosis, trace edema   Data Reviewed: Basic Metabolic  Panel:  Recent Labs Lab 09/09/14 2022 09/10/14 0524 09/11/14 0500 09/13/14 0517 09/14/14 0535  NA 137 137 136 136 133*  K 4.8 4.0 4.0 4.9 4.2  CL 96* 97* 97* 97* 92*  CO2 12* 13* 12* 12* 14*  GLUCOSE  154* 128* 118* 77 76  BUN 74* 75* 80* 118* 115*  CREATININE 2.33* 2.31* 2.23* 2.41* 2.43*  CALCIUM 9.5 9.4 9.4 9.4 9.4   Liver Function Tests:  Recent Labs Lab 09/08/14 1604 09/09/14 0400  AST 115* 110*  ALT 47 <5*  ALKPHOS 490* 474*  BILITOT 1.0 1.3*  PROT 6.7 6.2*  ALBUMIN 2.5* 2.4*   No results for input(s): LIPASE, AMYLASE in the last 168 hours. No results for input(s): AMMONIA in the last 168 hours. CBC:  Recent Labs Lab 09/08/14 1604 09/10/14 0524 09/11/14 0500 09/12/14 0250 09/13/14 0517 09/14/14 0535  WBC 14.0* 19.7* 22.4* 21.3* 21.8* 17.4*  NEUTROABS 12.0*  --   --   --   --   --   HGB 11.8* 10.8* 10.6* 10.5* 10.2* 10.4*  HCT 36.4* 33.3* 32.9* 32.3* 30.7* 31.0*  MCV 73.4* 74.3* 73.9* 73.1* 71.9* 71.8*  PLT 184 230 232 204 224 214   Cardiac Enzymes:    Recent Labs Lab 09/08/14 1604 09/08/14 2200 09/09/14 0400 09/09/14 1220  CKTOTAL 24*  --   --   --   TROPONINI 0.03 0.04* 0.03 0.03   BNP (last 3 results) No results for input(s): BNP in the last 8760 hours.  ProBNP (last 3 results) No results for input(s): PROBNP in the last 8760 hours.  CBG:  Recent Labs Lab 09/13/14 1137 09/13/14 1700 09/13/14 2053 09/14/14 0738 09/14/14 1216  GLUCAP 104* 96 105* 86 122*    Recent Results (from the past 240 hour(s))  Culture, blood (routine x 2)     Status: None   Collection Time: 09/08/14  4:27 PM  Result Value Ref Range Status   Specimen Description BLOOD RIGHT ANTECUBITAL  Final   Special Requests BOTTLES DRAWN AEROBIC AND ANAEROBIC 5 CC EA  Final   Culture   Final    NO GROWTH 5 DAYS Performed at [facility]    Report Status 09/13/2014 FINAL  Final  Culture, blood (routine x 2)     Status: None   Collection Time: 09/08/14  4:27 PM  Result Value Ref Range Status   Specimen Description BLOOD RIGHT HAND  Final   Special Requests BOTTLES DRAWN AEROBIC AND ANAEROBIC 5 CC EA  Final   Culture   Final    NO GROWTH 5 DAYS Performed at  [facility]    Report Status 09/13/2014 FINAL  Final  Urine culture     Status: None   Collection Time: 09/08/14  5:39 PM  Result Value Ref Range Status   Specimen Description URINE, CLEAN CATCH  Final   Special Requests Normal  Final   Culture   Final    8,000 COLONIES/mL INSIGNIFICANT GROWTH Performed at [facility]    Report Status 09/10/2014 FINAL  Final  MRSA PCR Screening     Status: None   Collection Time: 09/08/14  9:02 PM  Result Value Ref Range Status   MRSA by PCR NEGATIVE NEGATIVE Final    Comment:        The GeneXpert MRSA Assay (FDA approved for NASAL specimens only), is one component of a comprehensive MRSA colonization surveillance program. It is not intended to diagnose MRSA infection nor to guide  or monitor treatment for MRSA infections.      Studies: Dg Chest 2 View  09/14/2014   CLINICAL DATA:  Follow-up CHF and pneumonia.  Dyspnea.  EXAM: CHEST  2 VIEW  COMPARISON:  09/10/2014 chest radiograph  FINDINGS: Stable configuration of single lead left subclavian pacemaker with lead tip overlying the right ventricle stable cardiomediastinal silhouette with moderate cardiomegaly. No pneumothorax. No right pleural effusion. No overt pulmonary edema. Persistent complete left lower lobe consolidation. Likely stable small left pleural effusion. Mild degenerative changes in the thoracic spine.  IMPRESSION: 1. Stable cardiomegaly, without overt pulmonary edema. 2. Persistent complete left lower lobe consolidation and likely small left pleural effusion. Underlying central left lower lobe neoplasm not excluded, see 08/31/2014 CT report for further details.   Electronically Signed   By: Ilona Sorrel M.D.   On: 09/14/2014 11:40    Scheduled Meds: . carvedilol  12.5 mg Oral BID WC  . feeding supplement (ENSURE ENLIVE)  237 mL Oral BID BM  . furosemide  40 mg Oral BID  . guaiFENesin  600 mg Oral BID  . insulin aspart  0-5 Units Subcutaneous QHS  . insulin  aspart  0-9 Units Subcutaneous TID WC  . ipratropium-albuterol  3 mL Nebulization TID  . levofloxacin  750 mg Oral Q48H  . polyethylene glycol  17 g Oral Daily  . sodium bicarbonate  650 mg Oral TID  . warfarin  3 mg Oral ONCE-1800  . Warfarin - Pharmacist Dosing Inpatient   Does not apply q1800    Continuous Infusions: . heparin 1,100 Units/hr (09/13/14 1843)     Time spent: 25 minutes  Zaccheus Edmister, [place] [facility] Pager [phone]. If 7PM-7AM, please contact night-coverage at [email or web address], password [facility] 09/14/2014, 3:20 PM  LOS: 6 days

## 2014-09-14 NOTE — Progress Notes (Signed)
OT Cancellation Note  Patient Details Name: Vance Hochmuth MRN: [id number] DOB: 01/26/1931   Cancelled Treatment:    Reason Eval/Treat Not Completed: Patient at procedure or test/ unavailable  Will recheck on pt as schedule allows Kari Baars, Roby Payton Mccallum D 09/14/2014, 10:20 AM

## 2014-09-14 NOTE — Progress Notes (Signed)
OT Cancellation Note  Patient Details Name: Kyley Laurel MRN: [id number] DOB: 1931/08/18   Cancelled Treatment:    Reason Eval/Treat Not Completed: Other (comment).  Pt is at XRT. Will check back tomorrow.  Holden Maniscalco 09/14/2014, 2:10 PM  Lesle Chris, OTR/L [phone] 09/14/2014

## 2014-09-14 NOTE — Evaluation (Signed)
Physical Therapy Evaluation Patient Details Name: Bradley Rasmussen MRN: [id number] DOB: 1931/03/08 Today's Date: 09/14/2014   History of Present Illness  81 yo male admitted with SOB, A fib, weakness. Hx of HTN, DM, sleep apnea, pacemaker, sickle cell trait, lung cancer with mets to liver.   Clinical Impression  On eval, pt required Min assist for mobility-walked ~15 feet with RW. Pt is weak and fatigues easily with minimal activity. Dyspnea 2/4 at rest, 3/4 with activity. Will continue to follow and progress activity as able. May need to consider ST rehab if pt remains weak and/or wife cannot provide current level of assist.     Follow Up Recommendations Home health PT;Supervision/Assistance - 24 hour vs SNF (depending on progress)    Equipment Recommendations  Hospital bed;Wheelchair (measurements PT);Wheelchair cushion (measurements PT)    Recommendations for Other Services       Precautions / Restrictions Precautions Precautions: Fall Restrictions Weight Bearing Restrictions: No      Mobility  Bed Mobility Overal bed mobility: Needs Assistance Bed Mobility: Sit to Supine       Sit to supine: HOB elevated;Min guard   General bed mobility comments: close guard for safety. Increased time.   Transfers Overall transfer level: Needs assistance Equipment used: Rolling walker (2 wheeled) Transfers: Sit to/from Stand Sit to Stand: Min assist         General transfer comment: x2. Stood on 1st attempt for ~30 seconds-pt fatigued and had to sit and rest. Seated rest break for ~3-4 minutes before able to stand again  Ambulation/Gait Ambulation/Gait assistance: Min assist Ambulation Distance (Feet): 15 Feet Assistive device: Rolling walker (2 wheeled) Gait Pattern/deviations: Decreased stride length;Trunk flexed;Step-through pattern     General Gait Details: Assist to stabilize pt and maneuver with walker. Pt fatigued very quickly. Dyspnea 3/4  Stairs             Wheelchair Mobility    Modified Rankin (Stroke Patients Only)       Balance Overall balance assessment: Needs assistance         Standing balance support: Bilateral upper extremity supported;During functional activity Standing balance-Leahy Scale: Poor                               Pertinent Vitals/Pain Pain Assessment: No/denies pain    Home Living Family/patient expects to be discharged to:: Private residence Living Arrangements: Spouse/significant other   Type of Home: Mobile home Home Access: Stairs to enter Entrance Stairs-Rails: Right;Left (wobbly per wife) Technical brewer of Steps: 4 Home Layout: One level Home Equipment: Walker - 2 wheels;Cane - single point;Shower seat      Prior Function Level of Independence: Independent with assistive device(s)         Comments: using cane     Hand Dominance        Extremity/Trunk Assessment   Upper Extremity Assessment: Defer to OT evaluation           Lower Extremity Assessment: Generalized weakness      Cervical / Trunk Assessment: Normal  Communication   Communication: No difficulties  Cognition Arousal/Alertness: Awake/alert Behavior During Therapy: WFL for tasks assessed/performed Overall Cognitive Status: Within Functional Limits for tasks assessed                      General Comments      Exercises        Assessment/Plan    PT Assessment  Patient needs continued PT services  PT Diagnosis Difficulty walking;Generalized weakness   PT Problem List Decreased strength;Decreased activity tolerance;Decreased balance;Decreased mobility;Decreased knowledge of use of DME  PT Treatment Interventions DME instruction;Gait training;Functional mobility training;Therapeutic activities;Patient/family education;Balance training;Therapeutic exercise   PT Goals (Current goals can be found in the Care Plan section) Acute Rehab PT Goals Patient Stated Goal: to get  stronger PT Goal Formulation: With patient/family Time For Goal Achievement: 09/28/14 Potential to Achieve Goals: Fair    Frequency Min 3X/week   Barriers to discharge        Co-evaluation               End of Session Equipment Utilized During Treatment: Gait belt Activity Tolerance: Patient limited by fatigue Patient left: in bed;with call bell/phone within reach;with family/visitor present           Time: 2774-1287 PT Time Calculation (min) (ACUTE ONLY): 19 min   Charges:   PT Evaluation $Initial PT Evaluation Tier I: 1 Procedure     PT G Codes:        Weston Anna, MPT Pager: [phone]

## 2014-09-14 NOTE — Progress Notes (Signed)
CMT called and states pt had heart beat noted "failure to pace" Strips saved on chart. CMT states this is the first episode, will continue to monitor. SRP, RN

## 2014-09-14 NOTE — Care Management Note (Signed)
Case Management Note  Patient Details  Name: Bradley Rasmussen MRN: [id number] Date of Birth: 03/25/1931  Subjective/Objective:  PT-recc HHPT,hospital bed, w/c. Will provide [facility] agency list, await choice.    [facility] already following.              Action/Plan:d/c plan home w/HHC.   Expected Discharge Date:                  Expected Discharge Plan:  [place]  In-House Referral:     Discharge planning Services  CM Consult  Post Acute Care Choice:    Choice offered to:  Patient  DME Arranged:    DME Agency:     HH Arranged:    [place] Agency:     Status of Service:  In process, will continue to follow  Medicare Important Message Given:  Yes-third notification given Date Medicare IM Given:    Medicare IM give by:    Date Additional Medicare IM Given:    Additional Medicare Important Message give by:     If discussed at [place] of Stay Meetings, dates discussed:    Additional Comments:  Dessa Phi, RN 09/14/2014, 4:00 PM

## 2014-09-14 NOTE — Progress Notes (Signed)
Patient refuse CPAP tonight. He states he does not wear one at home.

## 2014-09-14 NOTE — Patient Outreach (Signed)
[facility]) Care Management  09/14/2014  Bradley Rasmussen 08-14-1931 [id number]   Referral from Marthenia Rolling, RN, assigned Tomasa Rand, RN to outreach.  Thanks, [name]. [place], [place] Assistant Phone: [phone] Fax: [phone]

## 2014-09-14 NOTE — Care Management Important Message (Signed)
Important Message  Patient Details  Name: Bradley Rasmussen MRN: [id number] Date of Birth: [birth year]   Medicare Important Message Given:  Yes-third notification given    [name] 09/14/2014, 2:27 [place] Message  Patient Details  Name: Bradley Rasmussen MRN: [id number] Date of Birth: May 23, 1931   Medicare Important Message Given:  Yes-third notification given    [name] 09/14/2014, 2:27 PM

## 2014-09-14 NOTE — Progress Notes (Signed)
I stopped by to see patient today. He asked that I follow-up tomorrow.  His wife reports she has no concerns today and is agreeable to sitting down tomorrow to discuss plan of care moving forward.  Micheline Rough, MD [place] Team [phone]

## 2014-09-14 NOTE — Progress Notes (Signed)
Pt coughed up small amount of blood tinged phelgm in a cup, denies throat discomfort. Will continue to monitor. Pt remains on Heparin as prescribed. SRP, RN

## 2014-09-14 NOTE — Progress Notes (Signed)
[facility] Therapy Treatment Record Phone [phone]   Radiation Therapy was administered to Bradley Rasmussen on: 09/14/2014  1:54 PM and was treatment # *2  out of a planned course of 10 treatments.

## 2014-09-14 NOTE — Discharge Instructions (Signed)

## 2014-09-14 NOTE — Progress Notes (Signed)
ANTICOAGULATION CONSULT NOTE -Follow Up Consult  Pharmacy Consult for Heparin-->Coumadin Indication: atrial fibrillation  Allergies  Allergen Reactions  . Amoxicillin Itching and Rash    Patient Measurements: Height: '5\' 9"'$  (175.3 cm) Weight: 213 lb 10 oz (96.9 kg) IBW/kg (Calculated) : 70.7 Heparin Dosing Weight: 89.3 kg  Vital Signs: Temp: 97.9 F (36.6 C) (09/01 0500) Temp Source: Oral (09/01 0500) BP: 114/66 mmHg (09/01 0500) Pulse Rate: 85 (09/01 0500)  Labs:  Recent Labs  09/12/14 0250 09/12/14 1112 09/13/14 0517 09/14/14 0535  HGB 10.5*  --  10.2* 10.4*  HCT 32.3*  --  30.7* 31.0*  PLT 204  --  224 214  LABPROT  --   --   --  15.1  INR  --   --   --  1.17  HEPARINUNFRC 0.55 0.50 0.46 0.40  CREATININE  --   --  2.41* 2.43*    Estimated Creatinine Clearance: 27.8 mL/min (by C-G formula based on Cr of 2.43).   Medical History: Past Medical History  Diagnosis Date  . High blood pressure   . Irregular heart rhythm   . Asthma   . Diabetes mellitus   . High cholesterol   . Kidney disease   . Sleep apnea   . Sickle cell trait   . A-fib   . Complete heart block   . Pacemaker     Medications:  Scheduled:  . carvedilol  12.5 mg Oral BID WC  . feeding supplement (ENSURE ENLIVE)  237 mL Oral BID BM  . furosemide  40 mg Intravenous Q12H  . guaiFENesin  600 mg Oral BID  . insulin aspart  0-5 Units Subcutaneous QHS  . insulin aspart  0-9 Units Subcutaneous TID WC  . ipratropium-albuterol  3 mL Nebulization TID  . levofloxacin  750 mg Oral Q48H  . polyethylene glycol  17 g Oral Daily  . sodium bicarbonate  650 mg Oral TID  . Warfarin - Pharmacist Dosing Inpatient   Does not apply q1800   Infusions:  . heparin 1,100 Units/hr (09/13/14 1843)    Assessment: 81 year old male recently diagnosed with left lower lobe lung mass with metastasis to the liver, s/p biopsy on 09/05/14.  He was admitted 09/08/14 with worsening shortness of breath and weakness.     Patient was taking apixaban (eliquis) for chronic Afib.  Given ARF and compromised hepatic function, CCM has consulted Pharmacy to transition to IV heparin.  Now starting Coumadin. Patient was on Coumadin '1mg'$  ~ 67yrago prior to switching to Eliquis.  09/14/2014  INR 1.17 HL therapeutic this morning H/H stable but trending down Plts WNL No overt bleeding noted Drug Interaction= Levaquin  Goal of Therapy:  Heparin level 0.3-0.7 units/ml  Monitor platelets by anticoagulation protocol: Yes  INR 2-3   Plan:   Continue heparin drip @ 1100 units/hr  Check daily heparin level & CBC while on heparin  Warfarin '3mg'$  po x 1 @ 1800  Daily INR  F/U Coumadin education  EDolly RiasRPh 09/14/2014, 8:25 AM Pager [phone]

## 2014-09-15 ENCOUNTER — Ambulatory Visit
Admit: 2014-09-15 | Discharge: 2014-09-15 | Disposition: A | Discharge status: Home / Self Care | Payer: Commercial Managed Care - HMO | Attending: Radiation Oncology | Admitting: Radiation Oncology

## 2014-09-15 DIAGNOSIS — N179 Acute kidney failure, unspecified: Secondary | ICD-10-CM

## 2014-09-15 LAB — GLUCOSE, CAPILLARY
GLUCOSE-CAPILLARY: 105 mg/dL — AB (ref 65–99)
Glucose-Capillary: 127 mg/dL — ABNORMAL HIGH (ref 65–99)
Glucose-Capillary: 134 mg/dL — ABNORMAL HIGH (ref 65–99)
Glucose-Capillary: 133 mg/dL — ABNORMAL HIGH (ref 65–99)

## 2014-09-15 LAB — BASIC METABOLIC PANEL
Anion gap: 28 — ABNORMAL HIGH (ref 5–15)
BUN: 100 mg/dL — AB (ref 6–20)
CALCIUM: 9.6 mg/dL (ref 8.9–10.3)
CO2: 16 mmol/L — ABNORMAL LOW (ref 22–32)
CREATININE: 2.13 mg/dL — AB (ref 0.61–1.24)
Chloride: 90 mmol/L — ABNORMAL LOW (ref 101–111)
GFR calc Af Amer: 32 mL/min — ABNORMAL LOW (ref >60)
GFR, EST NON AFRICAN AMERICAN: 28 mL/min — AB (ref >60)
GLUCOSE: 83 mg/dL (ref 65–99)
POTASSIUM: 4.1 mmol/L (ref 3.5–5.1)
SODIUM: 134 mmol/L — AB (ref 135–145)

## 2014-09-15 LAB — HEPARIN LEVEL (UNFRACTIONATED): HEPARIN UNFRACTIONATED: 0.45 [IU]/mL (ref 0.30–0.70)

## 2014-09-15 LAB — CBC
HCT: 31 % — ABNORMAL LOW (ref 39.0–52.0)
Hemoglobin: 10.3 g/dL — ABNORMAL LOW (ref 13.0–17.0)
MCH: 24.1 pg — ABNORMAL LOW (ref 26.0–34.0)
MCHC: 33.2 g/dL (ref 30.0–36.0)
MCV: 72.4 fL — AB (ref 78.0–100.0)
PLATELETS: 201 10*3/uL (ref 150–400)
RBC: 4.28 MIL/uL (ref 4.22–5.81)
RDW: 18 % — AB (ref 11.5–15.5)
WBC: 18.1 10*3/uL — AB (ref 4.0–10.5)

## 2014-09-15 LAB — PROTIME-INR
INR: 1.11 (ref 0.00–1.49)
PROTHROMBIN TIME: 14.5 s (ref 11.6–15.2)

## 2014-09-15 MED ORDER — OXYCODONE HCL 5 MG PO TABS
5.0000 mg | ORAL_TABLET | ORAL | Status: DC | PRN
  Administered 2014-09-16 – 2014-09-17 (×2): 5 mg via ORAL
  Filled 2014-09-15 (×2): qty 1

## 2014-09-15 MED ORDER — LACTULOSE 10 GM/15ML PO SOLN
20.0000 g | Freq: Three times a day (TID) | ORAL | Status: DC
Start: 2014-09-15 — End: 2014-09-18
  Administered 2014-09-15 – 2014-09-17 (×9): 20 g via ORAL
  Filled 2014-09-15 (×11): qty 30

## 2014-09-15 MED ORDER — WARFARIN SODIUM 3 MG PO TABS
3.0000 mg | ORAL_TABLET | Freq: Once | ORAL | Status: AC
Start: 2014-09-15 — End: 2014-09-15
  Administered 2014-09-15: 3 mg via ORAL
  Filled 2014-09-15: qty 1

## 2014-09-15 NOTE — Progress Notes (Signed)
Physical Therapy Treatment Patient Details Name: Bradley Rasmussen MRN: [id number] DOB: [birth year] Today's Date: 09/15/2014    History of Present Illness 81 yo male admitted with SOB, A fib, weakness. Hx of HTN, DM, sleep apnea, pacemaker, sickle cell trait, lung cancer with mets to liver.     PT Comments    Pt continues to be weak. Fatigues easily/fairly quickly. +2 for safety when ambulating.   Follow Up Recommendations  Home health PT;Supervision/Assistance - 24 hour     Equipment Recommendations  Hospital bed;Wheelchair (measurements PT);Wheelchair cushion (measurements PT)    Recommendations for Other Services OT consult     Precautions / Restrictions Precautions Precautions: Fall Restrictions Weight Bearing Restrictions: No    Mobility  Bed Mobility               General bed mobility comments: sitting EOB  Transfers Overall transfer level: Needs assistance Equipment used: Rolling walker (2 wheeled) Transfers: Sit to/from Stand Sit to Stand: Min assist         General transfer comment: Assist to rise, stabilize, control descent. VCs for safety, hand placement. Increased time.   Ambulation/Gait Ambulation/Gait assistance: Min assist Ambulation Distance (Feet): 50 Feet Assistive device: Rolling walker (2 wheeled) Gait Pattern/deviations: Decreased stride length;Trunk flexed;Step-through pattern     General Gait Details: Assist to stabilize pt and maneuver with walker. Pt fatigued very quickly. Dyspnea 3/4. Followed closely with recliner. 1 standing rest break taken after ~25 feet.   Stairs            Wheelchair Mobility    Modified Rankin (Stroke Patients Only)       Balance                                    Cognition Arousal/Alertness: Awake/alert Behavior During Therapy: WFL for tasks assessed/performed Overall Cognitive Status: Within Functional Limits for tasks assessed                      Exercises       General Comments        Pertinent Vitals/Pain Pain Assessment: No/denies pain    Home Living Family/patient expects to be discharged to:: Private residence Living Arrangements: Spouse/significant other Available Help at Discharge: Family Type of Home: Mobile home       Home Equipment: Bedside commode;Shower seat      Prior Function Level of Independence: Independent with assistive device(s)      Comments: using cane   PT Goals (current goals can now be found in the care plan section) Acute Rehab PT Goals Patient Stated Goal: to get stronger Progress towards PT goals: Progressing toward goals (slowly)    Frequency  Min 3X/week    PT Plan Current plan remains appropriate    Co-evaluation             End of Session Equipment Utilized During Treatment: Oxygen;Gait belt Activity Tolerance: Patient limited by fatigue Patient left: in chair;with call bell/phone within reach;with family/visitor present     Time: 0923-3007 PT Time Calculation (min) (ACUTE ONLY): 14 min  Charges:  $Gait Training: 8-22 mins                    G Codes:      Weston Anna, MPT Pager: [phone]

## 2014-09-15 NOTE — Evaluation (Signed)
Occupational Therapy Evaluation Patient Details Name: Bradley Rasmussen MRN: [id number] DOB: [birth year] Today's Date: 09/15/2014    History of Present Illness 81 yo male admitted with SOB, A fib, weakness. Hx of HTN, DM, sleep apnea, pacemaker, sickle cell trait, lung cancer with mets to liver.    Clinical Impression   This 81 year old man was admitted for the above.  Prior to admission, he was mod I with adls.  He currently needs mod to max A for LB adls.  Goals are for min guard to min A for toileting and will focus on energy conservation in acute.  Recommend continued HHOT to increase endurance/independence with adls.    Follow Up Recommendations  Home health OT;Supervision/Assistance - 24 hour    Equipment Recommendations  None recommended by OT    Recommendations for Other Services       Precautions / Restrictions Precautions Precautions: Fall Restrictions Weight Bearing Restrictions: No      Mobility Bed Mobility               General bed mobility comments: oob  Transfers   Equipment used: Rolling walker (2 wheeled) Transfers: Sit to/from Stand Sit to Stand: Min assist         General transfer comment: cues for scooting forward and hand placement    Balance                                            ADL Overall ADL's : Needs assistance/impaired     Grooming: Oral care;Minimal assistance;Sitting (help reach supplies)   Upper Body Bathing: Minimal assitance;Sitting   Lower Body Bathing: Moderate assistance;Sit to/from stand   Upper Body Dressing : Minimal assistance;Sitting   Lower Body Dressing: Maximal assistance;Sit to/from stand                 General ADL Comments: Pt with 2/4 dyspnea with light activity. Sats 95% on 1 liter 02. Wife has reacher from when she had knee sx, but she will also assist pt as needed     Vision     Perception     Praxis      Pertinent Vitals/Pain Pain Assessment: No/denies pain      Hand Dominance     Extremity/Trunk Assessment Upper Extremity Assessment Upper Extremity Assessment: Generalized weakness (grossly 4-/5)           Communication Communication Communication: No difficulties   Cognition Arousal/Alertness: Awake/alert Behavior During Therapy: WFL for tasks assessed/performed Overall Cognitive Status: Within Functional Limits for tasks assessed                     General Comments       Exercises       Shoulder Instructions      Home Living Family/patient expects to be discharged to:: Private residence Living Arrangements: Spouse/significant other Available Help at Discharge: Family Type of Home: Mobile home                 Bathroom Toilet: Standard     Home Equipment: Bedside commode;Shower seat          Prior Functioning/Environment Level of Independence: Independent with assistive device(s)        Comments: using cane    OT Diagnosis: Generalized weakness   OT Problem List: Decreased strength;Decreased activity tolerance;Cardiopulmonary status limiting activity   OT Treatment/Interventions:  Self-care/ADL training;DME and/or AE instruction;Patient/family education;Therapeutic activities;Energy conservation    OT Goals(Current goals can be found in the care plan section) Acute Rehab OT Goals Patient Stated Goal: to get stronger OT Goal Formulation: With patient/family Time For Goal Achievement: 09/29/14 Potential to Achieve Goals: Good ADL Goals Pt Will Transfer to Toilet: with min guard assist;bedside commode;stand pivot transfer Pt Will Perform Toileting - Clothing Manipulation and hygiene: with min assist;sit to/from stand Additional ADL Goal #1: pt will initiate rest breaks without cues for energy conservation  OT Frequency: Min 2X/week   Barriers to D/C:            Co-evaluation              End of Session    Activity Tolerance: Patient tolerated treatment well Patient left: in  chair;with call bell/phone within reach   Time: 1150-1206 OT Time Calculation (min): 16 min Charges:  OT General Charges $OT Visit: 1 Procedure OT Evaluation $Initial OT Evaluation Tier I: 1 Procedure G-Codes:    Ajay Strubel 2014/10/08, 12:44 PM  Lesle Chris, OTR/L [phone] 10/08/14

## 2014-09-15 NOTE — Progress Notes (Signed)
Pt weight increased this am in comparison to previous weight on 9/1. Please review flowsheet. SRP, RN

## 2014-09-15 NOTE — Care Management Note (Signed)
Case Management Note  Patient Details  Name: Bradley Rasmussen MRN: [id number] Date of Birth: [birth year]  Subjective/Objective:  Spoke to spouse about d/c plans, & recc. [facility] chosen for HHC-referral to rep Kristen. Recommend HHRN/HHPT/HHOT/HH Nurse's aide/HH Education officer, museum.Informed spouse of 24/7 asst-private duty care-custodial level-out of pocket expense. She voiced understanding.[facility] already following.                  Action/Plan:d/c plan home w/HHC.   Expected Discharge Date:                  Expected Discharge Plan:  [place]  In-House Referral:     Discharge planning Services  CM Consult  Post Acute Care Choice:    Choice offered to:  Spouse  DME Arranged:    DME Agency:     HH Arranged:    [place] Agency:  [facility]  Status of Service:  In process, will continue to follow  Medicare Important Message Given:  Yes-third notification given Date Medicare IM Given:    Medicare IM give by:    Date Additional Medicare IM Given:    Additional Medicare Important Message give by:     If discussed at [place] of Stay Meetings, dates discussed:    Additional Comments:  Dessa Phi, RN 09/15/2014, 10:58 AM

## 2014-09-15 NOTE — Progress Notes (Signed)
Daily Progress Note   Patient Name: Bradley Rasmussen       Date: 09/15/2014 DOB: July 30, 1931  Age: 81 y.o. MRN#: [id number] Attending Physician: Bradley Shiley, MD Primary Care Physician: Bradley Collie, MD Admit Date: 09/08/2014  Reason for Consultation/Follow-up: Establishing goals of care  Subjective: Patient is a 81 year old gentleman with a past medical history significant for diabetes hypertension asthma and history of smoking. Patient was recently found to have a lung mass and liver metastases. Patient has been seen and evaluated by oncology. Patient has also been seen by pulmonary critical care medicine. At this time, working diagnosis is metastatic lung cancer as his liver biopsy shows metastatic carcinoma and he has left lower lobe consolidation secondary to bronchial obstruction. Patient also has bony metastases and significant hilar or mediastinal adenopathy. He has a pacemaker. It is noted from oncology notes that it has been discussed with the patient and family that his cancer is incurable at this stage, this is a new diagnosis of cancer for this patient. It is deemed to be aggressive in nature. Palliative radiation has been recommended for symptom management to improve his dyspnea.   Interval Events: I met with Bradley Rasmussen and his wife.  We had a conversation regarding his [place] moving forward.  He wants to focus on therapies that are likely to work toward his goal of being at home while limiting interventions that are not likely work toward this goal.    He believes that hospital level care has been helpful to him.  We discussed that the hospital can be useful as long as he is getting well enough from care he receives at the hospital to enjoy his time at home, but there is going to come a time in the near future where, if his goal is to be at home, he may be better served to plan on being at home and bringing care to him at home rather repeated trips to the hospital.    We also discussed  limiting the care he receives at the hospital to interventions that are likely to get him well enough to return to his home.  He reports he does not want to pursue therapies that are not in line with a path home.  We specifically discussed mechanical ventilation, CPR, antibiotics, IVF, and feeding tubes.  We completed a MOST form outlining his wishes.  - Completed MOST form.  Patient wishes for DNR/DNI, Limited additional interventions, Antibiotics and IVF if indicated, and no feeding tube.  This was discussed with the patient and his wife. - He still has been having some SOB.  He does feel that it is better this AM after getting opioid for leg pain.  Would continue opioid as needed for dyspnea in addition to indication for pain.   Length of Stay: 7 days  Current Medications: Scheduled Meds:  . carvedilol  12.5 mg Oral BID WC  . feeding supplement (ENSURE ENLIVE)  237 mL Oral BID BM  . furosemide  40 mg Oral BID  . guaiFENesin  600 mg Oral BID  . insulin aspart  0-5 Units Subcutaneous QHS  . insulin aspart  0-9 Units Subcutaneous TID WC  . ipratropium-albuterol  3 mL Nebulization TID  . lactulose  20 g Oral TID  . levofloxacin  750 mg Oral Q48H  . polyethylene glycol  17 g Oral Daily  . sodium bicarbonate  1,300 mg Oral TID  . warfarin  3 mg Oral ONCE-1800  . Warfarin - Pharmacist Dosing  Inpatient   Does not apply q1800    Continuous Infusions: . heparin 1,100 Units/hr (09/14/14 1722)    PRN Meds: albuterol, chlorpheniramine-HYDROcodone, docusate sodium, oxyCODONE, technetium albumin aggregated, technetium TC 25M diethylenetriame-pentaacetic acid  Palliative Performance Scale: 40%     Vital Signs: BP 115/68 mmHg  Pulse 85  Temp(Src) 98 F (36.7 C) (Oral)  Resp 18  Ht 5' 9" (1.753 m)  Wt 113.2 kg (249 lb 9 oz)  BMI 36.84 kg/m2  SpO2 95% SpO2: SpO2: 95 % O2 Device: O2 Device: Nasal Cannula O2 Flow Rate: O2 Flow Rate (L/min): 1 L/min  Intake/output summary:    Intake/Output Summary (Last 24 hours) at 09/15/14 0918 Last data filed at 09/15/14 0434  Gross per 24 hour  Intake      0 ml  Output   2650 ml  Net  -2650 ml   LBM:   Baseline Weight: Weight: 91.1 kg (200 lb 13.4 oz) Most recent weight: Weight: 113.2 kg (249 lb 9 oz)  Physical Exam: General: Alert, awake, in no acute distress.  HEENT: No bruits, no goiter, no JVD Heart: Regular rate and rhythm. No murmur appreciated. Lungs: Diminished air movement bilaterally Abdomen: Soft, nontender, nondistended, positive bowel sounds.  Ext: LE edema Skin: Warm and dry Neuro: Grossly intact, nonfocal.              Additional Data Reviewed: Recent Labs     09/14/14  0535  09/15/14  0507  WBC  17.4*  18.1*  HGB  10.4*  10.3*  PLT  214  201  NA  133*  134*  BUN  115*  100*  CREATININE  2.43*  2.13*     Problem List:  Patient Active Problem List   Diagnosis Date Noted  . Goals of care, counseling/discussion   . Metastatic lung cancer (metastasis from lung to other site)   . Malnutrition of moderate degree 09/10/2014  . Encounter for palliative care   . Dyspnea   . Metastatic carcinoma involving liver with unknown primary site 09/09/2014  . Metastatic primary lung cancer 09/09/2014  . Malignant neoplasm of lower lobe of left lung   . Dehydration 09/08/2014  . Acute renal failure 09/08/2014  . Hyperkalemia 09/08/2014  . Hypokalemia 09/08/2014  . Lactic acidosis 09/08/2014  . Systolic CHF, chronic 56/38/7564  . Liver mass   . Lung mass 08/29/2014  . Abdominal pain 08/29/2014  . OSA on CPAP 03/23/2013  . Shortness of breath 01/31/2013  . CHF (congestive heart failure) 01/31/2013  . Anemia 01/31/2013     Palliative Care Assessment & Plan    Code Status:  DNR  Goals of Care: - Patient reports that being home and spending time with family is what is most important to him.  He is invested in seeing how he does with radiation, and reports being hopeful that he may be  able to get further treatment (such as chemo) after this. - Discussed that while he finds being in the hospital helpful this admission, there will come a time in the near future where, if his goal is to be at home, he will likely be better served through plan of being home and staying home rather than returning to the hospital.  He will continue this conversation with his family and other physicians after his discharge. -   Completed MOST form.  Patient wishes for DNR/DNI, Limited additional interventions, Antibiotics and IVF if indicated, and no feeding tube.    Symptom Management: SOB: improved  following oxycodone.  Recommend continue oxycodone as needed for dyspnea in addition to indication for pain.  Psycho-social/Spiritual:  Desire for further Chaplaincy support:no   Prognosis: < 6 months Discharge Planning: to be determined   Care plan was discussed with patient and his wife  Thank you for allowing the Palliative Medicine Team to assist in the care of this patient.   Time In: 8:30 Time Out: 915 Total Time 45 Prolonged Time Billed  no     Greater than 50%  of this time was spent counseling and coordinating care related to the above assessment and plan.   Micheline Rough, MD  09/15/2014, 9:18 AM  Please contact Palliative Medicine Team phone at [phone] for questions and concerns.

## 2014-09-15 NOTE — Progress Notes (Signed)
Speech Language Pathology Treatment: Dysphagia  Patient Details Name: Bradley Rasmussen MRN: [id number] DOB: 01-17-1931 Today's Date: 09/15/2014 Time: 2787-1836 SLP Time Calculation (min) (ACUTE ONLY): 17 min  Assessment / Plan / Recommendation Clinical Impression  Pt observed being fed lunch by spouse - soup, chicken and rice, and water.  No indications of dysphagia or airway compromise.  Pt did indicate to spouse need to rest during meal due to his shortness of breath.  He reports his swallow ability to be the same and his spouse is monitoring for radiation side effects.  Spouse independently stated need to assure pt was not dyspneic with intake!     Spouse reports she has asked for chopped meats/extra gravy and sauce with each meal as ordered but has not consistently received them. SLP advised RN to occurrence and requested she monitor closely. Tatiana from service assist who assured this SLP that pt would receive meats as ordered.   SLP advised spouse/pt to compensation strategies to mitigate his dysphagia.  Spouse and pt report comfort with current diet and dysphagia education.  All goals met, SLP to sign off, please reorder if indicated.    HPI Other Pertinent Information: 81 yo male adm [facility]08[facility]14 with progressive shortness of breath and weakness/abdominal pan.  PMH + for very recent cancer diagnosis  - ?primary lung with liver mets, asthma, sickle cell carrier, DM, smoker, sleep apnea.  Per [facility] note, pt with " Progressive Left lower lobe obstruction, r/o primary lung cancer, r/o post obstructive PNA, low suspicion PE and VQ low prob and doppler neg= neg PE, OSA,Bronchospasm - ? Fluid overload, treat as COPD flare" .  Pt for palliative radiation per notes.   Dysphagia that has been noted in first few days of admit - [facility] notes - "r/o secondary to lymphadenopathy".   Pt also with CKD and chronic systolic HF per [facility] notes.   Swallow eval ordered by hospitalist.     Pertinent Vitals Pain  Assessment: No/denies pain  SLP Plan    DC SLP   Recommendations Diet recommendations: Regular;Thin liquid (ground meats) Liquids provided via: Cup;Straw Medication Administration:  (as tolerated) Supervision: Patient able to self feed Compensations: Slow rate;Small sips/bites Postural Changes and/or Swallow Maneuvers: Upright 30-60 min after meal;Seated upright 90 degrees (rest if short of breath or coughing)                   [place], [place] [facility] SLP [phone]

## 2014-09-15 NOTE — Progress Notes (Signed)
ANTICOAGULATION CONSULT NOTE -Follow Up Consult  Pharmacy Consult for Heparin-->Coumadin Indication: atrial fibrillation  Allergies  Allergen Reactions  . Amoxicillin Itching and Rash    Patient Measurements: Height: '5\' 9"'$  (175.3 cm) Weight: 249 lb 9 oz (113.2 kg) IBW/kg (Calculated) : 70.7 Heparin Dosing Weight: 89.3 kg  Vital Signs: Temp: 98 F (36.7 C) (09/02 0426) Temp Source: Oral (09/02 0426) BP: 115/68 mmHg (09/02 0426) Pulse Rate: 85 (09/02 0426)  Labs:  Recent Labs  09/13/14 0517 09/14/14 0535 09/15/14 0507  HGB 10.2* 10.4* 10.3*  HCT 30.7* 31.0* 31.0*  PLT 224 214 201  LABPROT  --  15.1 14.5  INR  --  1.17 1.11  HEPARINUNFRC 0.46 0.40 0.45  CREATININE 2.41* 2.43*  --     Estimated Creatinine Clearance: 30.1 mL/min (by C-G formula based on Cr of 2.43).   Medical History: Past Medical History  Diagnosis Date  . High blood pressure   . Irregular heart rhythm   . Asthma   . Diabetes mellitus   . High cholesterol   . Kidney disease   . Sleep apnea   . Sickle cell trait   . A-fib   . Complete heart block   . Pacemaker     Medications:  Scheduled:  . carvedilol  12.5 mg Oral BID WC  . feeding supplement (ENSURE ENLIVE)  237 mL Oral BID BM  . furosemide  40 mg Oral BID  . guaiFENesin  600 mg Oral BID  . insulin aspart  0-5 Units Subcutaneous QHS  . insulin aspart  0-9 Units Subcutaneous TID WC  . ipratropium-albuterol  3 mL Nebulization TID  . lactulose  20 g Oral TID  . levofloxacin  750 mg Oral Q48H  . polyethylene glycol  17 g Oral Daily  . sodium bicarbonate  1,300 mg Oral TID  . Warfarin - Pharmacist Dosing Inpatient   Does not apply q1800   Infusions:  . heparin 1,100 Units/hr (09/14/14 1722)    Assessment: 81 year old male recently diagnosed with left lower lobe lung mass with metastasis to the liver, s/p biopsy on 09/05/14.  He was admitted 09/08/14 with worsening shortness of breath and weakness.    Patient was taking apixaban  (eliquis) for chronic Afib.  Given ARF and compromised hepatic function, CCM has consulted Pharmacy to transition to IV heparin.  Now starting Coumadin. Patient was on Coumadin '1mg'$  ~ 33yrago prior to switching to Eliquis.  09/15/2014  INR 1.11 (subtherapeutic) HL therapeutic this morning H/H stable Plts WNL No overt bleeding noted Consuming < 50% of heart healthy diet, but is taking supplements Drug Interaction= Levaquin  Goal of Therapy:  Heparin level 0.3-0.7 units/ml  Monitor platelets by anticoagulation protocol: Yes  INR 2-3   Plan:   Continue heparin drip @ 1100 units/hr  Check daily heparin level & CBC while on heparin  Warfarin '3mg'$  po x 1 @ 1800  Daily INR  Coumadin education completed  EDolly RiasRPh 09/15/2014, 7:46 AM Pager [phone]

## 2014-09-15 NOTE — Progress Notes (Signed)
PROGRESS NOTE  Bradley Rasmussen WUJ:[id number] DOB: 05-20-1931 DOA: 09/08/2014 PCP: Marco Collie, MD  HPI/Recap of past 42 hours: 81 year old male with past mental history of recently diagnosed metastatic lung cancer admitted on the evening of 8/26 with progressively worsening dyspnea on exertion and acute renal failure. Initially thought to be in septic shock from postobstructive pneumonia. Seen by critical care.  Critical care clarified that his lactic acidosis was not from sepsis, but from his liver metastases. Patient's breathing significantly improved over the initial 24 hours, and he was transferred to the floor.  Over the past few days, breathing appeared more labored.swallow eval checked and found to be unrevealing.  IVs stopped and patient started on diuretic.  Has started radiation therapy for palliative options. Pulmonary discontinued steroids.  Sleepy today, but wake up and answer questions, received a dose of pain medications.  Breathing better today.   Assessment/Plan:  Acute Hypoxic Respiratory Failure:  Shortness of breath/dyspnea on exertion:  Likely from Postobstructive lung mass  plus underlying COPD from smoking. Continue nebulizers.   Steroids stopped Appreciate pulmonary assistance. On Levaquin.  Repeat chest x ray in am with persistent lung mass. No pulmonary edema/  Continue with  lasix to oral.    Acute kidney injury in the setting of stage III chronic kidney disease: Likely from hypotension from dehydration initially. Start sodium bicarb for metabolic acidosis.  Change lasix to oral. Good urine out put.  Renal function stable.    Hyperkalemia: Noted on admission.status post Kayexalate. Now resolved.  Lactic acidosis: Not sepsis. Lactic acidosis secondary liver metastases  Chronic systolic CHF. Monitoring daily weights, significant increased today. chest xray 9-01 no pulmonary edema. Change lasix to oral. Weight  99--97--96-weight up today. Would continue with  same dose of lasix.   Metastatic lung carcinoma involving liver with unknown primary site: Oncology seeing.  Palliative radiation recommended. Radiation oncology to see. Palliative care consulted.  Elevated ammonia level: started lactulose.   Atrial fibrillation: Chads 2 vas score of 5. Initially on eliquis, but with liver metastases and renal dysfunction, changed to IV heparin.  Started  coumadin.   diabetes mellitus controlled with renal disease: Normally on Glucotrol. A1c only at 5.6.   Code Status: Full code  Family Communication: wife at the bedside   Disposition Plan: Continue in-hospital until radiation therapy set, breathing felt to be stabilized. Anticipate discharge in next 1-2 days. Needs PT evaluation.    Consultants:  Oncology  Critical care  Procedures:  None  Antibiotics:  Aztreonam 8/26-8/27  Levaquin 8/26-present (changed to by mouth on 8/29)  Vancomycin 8/26-8/27   Objective: BP 111/73 mmHg  Pulse 86  Temp(Src) 98.2 F (36.8 C) (Oral)  Resp 20  Ht '5\' 9"'$  (1.753 m)  Wt 113.2 kg (249 lb 9 oz)  BMI 36.84 kg/m2  SpO2 95%  Intake/Output Summary (Last 24 hours) at 09/15/14 1802 Last data filed at 09/15/14 1500  Gross per 24 hour  Intake    240 ml  Output   4150 ml  Net  -3910 ml   Filed Weights   09/13/14 0602 09/14/14 0500 09/15/14 0426  Weight: 97.8 kg (215 lb 9.8 oz) 96.9 kg (213 lb 10 oz) 113.2 kg (249 lb 9 oz)    Exam:   General:  Alert and oriented 3, no acute distress  Cardiovascular: Regular rate and rhythm, S1-S2  Respiratory:  decreased breath sounds throughout, wheezing resolved  Abdomen: Soft, distended, and nontender, positive bowel sounds  Musculoskeletal: No clubbing or cyanosis, trace edema  Data Reviewed: Basic Metabolic Panel:  Recent Labs Lab 09/10/14 0524 09/11/14 0500 09/13/14 0517 09/14/14 0535 09/15/14 0507  NA 137 136 136 133* 134*  K 4.0 4.0 4.9 4.2 4.1  CL 97* 97* 97* 92* 90*  CO2 13* 12* 12*  14* 16*  GLUCOSE 128* 118* 77 76 83  BUN 75* 80* 118* 115* 100*  CREATININE 2.31* 2.23* 2.41* 2.43* 2.13*  CALCIUM 9.4 9.4 9.4 9.4 9.6   Liver Function Tests:  Recent Labs Lab 09/09/14 0400  AST 110*  ALT <5*  ALKPHOS 474*  BILITOT 1.3*  PROT 6.2*  ALBUMIN 2.4*   No results for input(s): LIPASE, AMYLASE in the last 168 hours.  Recent Labs Lab 09/14/14 1630  AMMONIA 83*   CBC:  Recent Labs Lab 09/11/14 0500 09/12/14 0250 09/13/14 0517 09/14/14 0535 09/15/14 0507  WBC 22.4* 21.3* 21.8* 17.4* 18.1*  HGB 10.6* 10.5* 10.2* 10.4* 10.3*  HCT 32.9* 32.3* 30.7* 31.0* 31.0*  MCV 73.9* 73.1* 71.9* 71.8* 72.4*  PLT 232 204 224 214 201   Cardiac Enzymes:    Recent Labs Lab 09/08/14 2200 09/09/14 0400 09/09/14 1220  TROPONINI 0.04* 0.03 0.03   BNP (last 3 results) No results for input(s): BNP in the last 8760 hours.  ProBNP (last 3 results) No results for input(s): PROBNP in the last 8760 hours.  CBG:  Recent Labs Lab 09/14/14 1642 09/14/14 2056 09/15/14 0749 09/15/14 1231 09/15/14 1646  GLUCAP 165* 119* 105* 127* 134*    Recent Results (from the past 240 hour(s))  Culture, blood (routine x 2)     Status: None   Collection Time: 09/08/14  4:27 PM  Result Value Ref Range Status   Specimen Description BLOOD RIGHT ANTECUBITAL  Final   Special Requests BOTTLES DRAWN AEROBIC AND ANAEROBIC 5 CC EA  Final   Culture   Final    NO GROWTH 5 DAYS Performed at [facility]    Report Status 09/13/2014 FINAL  Final  Culture, blood (routine x 2)     Status: None   Collection Time: 09/08/14  4:27 PM  Result Value Ref Range Status   Specimen Description BLOOD RIGHT HAND  Final   Special Requests BOTTLES DRAWN AEROBIC AND ANAEROBIC 5 CC EA  Final   Culture   Final    NO GROWTH 5 DAYS Performed at [facility]    Report Status 09/13/2014 FINAL  Final  Urine culture     Status: None   Collection Time: 09/08/14  5:39 PM  Result Value Ref Range  Status   Specimen Description URINE, CLEAN CATCH  Final   Special Requests Normal  Final   Culture   Final    8,000 COLONIES/mL INSIGNIFICANT GROWTH Performed at [facility]    Report Status 09/10/2014 FINAL  Final  MRSA PCR Screening     Status: None   Collection Time: 09/08/14  9:02 PM  Result Value Ref Range Status   MRSA by PCR NEGATIVE NEGATIVE Final    Comment:        The GeneXpert MRSA Assay (FDA approved for NASAL specimens only), is one component of a comprehensive MRSA colonization surveillance program. It is not intended to diagnose MRSA infection nor to guide or monitor treatment for MRSA infections.      Studies: No results found.  Scheduled Meds: . carvedilol  12.5 mg Oral BID WC  . feeding supplement (ENSURE ENLIVE)  237 mL Oral BID BM  . furosemide  40  mg Oral BID  . guaiFENesin  600 mg Oral BID  . insulin aspart  0-5 Units Subcutaneous QHS  . insulin aspart  0-9 Units Subcutaneous TID WC  . ipratropium-albuterol  3 mL Nebulization TID  . lactulose  20 g Oral TID  . levofloxacin  750 mg Oral Q48H  . polyethylene glycol  17 g Oral Daily  . sodium bicarbonate  1,300 mg Oral TID  . Warfarin - Pharmacist Dosing Inpatient   Does not apply q1800    Continuous Infusions: . heparin 1,100 Units/hr (09/15/14 1603)     Time spent: 25 minutes  Lexis Potenza, [place] [facility] Pager [phone]. If 7PM-7AM, please contact night-coverage at [email or web address], password [facility] 09/15/2014, 6:02 PM  LOS: 7 days

## 2014-09-16 LAB — CBC
HEMATOCRIT: 30.8 % — AB (ref 39.0–52.0)
HEMOGLOBIN: 10 g/dL — AB (ref 13.0–17.0)
MCH: 23.7 pg — ABNORMAL LOW (ref 26.0–34.0)
MCHC: 32.5 g/dL (ref 30.0–36.0)
MCV: 73 fL — ABNORMAL LOW (ref 78.0–100.0)
Platelets: 170 10*3/uL (ref 150–400)
RBC: 4.22 MIL/uL (ref 4.22–5.81)
RDW: 18.3 % — ABNORMAL HIGH (ref 11.5–15.5)
WBC: 17.5 10*3/uL — AB (ref 4.0–10.5)

## 2014-09-16 LAB — EXPECTORATED SPUTUM ASSESSMENT W GRAM STAIN, RFLX TO RESP C

## 2014-09-16 LAB — BASIC METABOLIC PANEL
ANION GAP: 27 — AB (ref 5–15)
BUN: 96 mg/dL — ABNORMAL HIGH (ref 6–20)
CALCIUM: 10 mg/dL (ref 8.9–10.3)
CO2: 17 mmol/L — AB (ref 22–32)
Chloride: 91 mmol/L — ABNORMAL LOW (ref 101–111)
Creatinine, Ser: 1.97 mg/dL — ABNORMAL HIGH (ref 0.61–1.24)
GFR, EST AFRICAN AMERICAN: 35 mL/min — AB (ref >60)
GFR, EST NON AFRICAN AMERICAN: 30 mL/min — AB (ref >60)
Glucose, Bld: 93 mg/dL (ref 65–99)
POTASSIUM: 4.4 mmol/L (ref 3.5–5.1)
Sodium: 135 mmol/L (ref 135–145)

## 2014-09-16 LAB — HEPARIN LEVEL (UNFRACTIONATED): Heparin Unfractionated: 0.49 IU/mL (ref 0.30–0.70)

## 2014-09-16 LAB — PROTIME-INR
INR: 1.22 (ref 0.00–1.49)
PROTHROMBIN TIME: 15.6 s — AB (ref 11.6–15.2)

## 2014-09-16 LAB — GLUCOSE, CAPILLARY
GLUCOSE-CAPILLARY: 134 mg/dL — AB (ref 65–99)
GLUCOSE-CAPILLARY: 126 mg/dL — AB (ref 65–99)
GLUCOSE-CAPILLARY: 92 mg/dL (ref 65–99)
Glucose-Capillary: 99 mg/dL (ref 65–99)

## 2014-09-16 LAB — EXPECTORATED SPUTUM ASSESSMENT W REFEX TO RESP CULTURE

## 2014-09-16 LAB — AMMONIA: Ammonia: 70 umol/L — ABNORMAL HIGH (ref 9–35)

## 2014-09-16 MED ORDER — WARFARIN SODIUM 5 MG PO TABS
5.0000 mg | ORAL_TABLET | Freq: Once | ORAL | Status: AC
  Administered 2014-09-16: 5 mg via ORAL
  Filled 2014-09-16: qty 1

## 2014-09-16 MED ORDER — IPRATROPIUM-ALBUTEROL 0.5-2.5 (3) MG/3ML IN SOLN
3.0000 mL | Freq: Two times a day (BID) | RESPIRATORY_TRACT | Status: DC
  Administered 2014-09-16 – 2014-09-18 (×4): 3 mL via RESPIRATORY_TRACT
  Filled 2014-09-16 (×4): qty 3

## 2014-09-16 NOTE — Progress Notes (Signed)
PROGRESS NOTE  Rhet Rorke FAO:[id number] DOB: December 03, 1931 DOA: 09/08/2014 PCP: Marco Collie, MD  HPI/Recap of past 23 hours: 81 year old male with past mental history of recently diagnosed metastatic lung cancer admitted on the evening of 8/26 with progressively worsening dyspnea on exertion and acute renal failure. Initially thought to be in septic shock from postobstructive pneumonia. Seen by critical care.  Critical care clarified that his lactic acidosis was not from sepsis, but from his liver metastases. Patient's breathing significantly improved over the initial 24 hours, and he was transferred to the floor.  Over the past few days, breathing appeared more labored.swallow eval checked and found to be unrevealing.  IVs stopped and patient started on diuretic.  Has started radiation therapy for palliative options. Pulmonary discontinued steroids.  He is alert, feeling ok, breathing ok. Had multiple BM yesterday   Assessment/Plan:  Acute Hypoxic Respiratory Failure:  Shortness of breath/dyspnea on exertion:  Likely from Postobstructive lung mass  plus underlying COPD from smoking. Continue nebulizers.   Steroids stopped Appreciate pulmonary assistance. On Levaquin. WBC trending down.  Repeat chest x ray in am with persistent lung mass. No pulmonary edema/  Continue with oral lasix.   Acute kidney injury in the setting of stage III chronic kidney disease: Likely from hypotension from dehydration initially. Start sodium bicarb for metabolic acidosis.  Change lasix to oral. Good urine out put.  Renal function stable. BUN trending down. Acidosis improving with bicarb tablet.    Hyperkalemia: Noted on admission.status post Kayexalate. Now resolved.  Lactic acidosis: Not sepsis. Lactic acidosis secondary liver metastases, renal failure.   Chronic systolic CHF. Monitoring daily weights, significant increased today. chest xray 9-01 no pulmonary edema. Change lasix to oral. Weight   99--97--96-weight up today. Would continue with same dose of lasix.   Metastatic lung carcinoma involving liver with unknown primary site: Oncology seeing.  Palliative radiation recommended. Started radiation treatment this admission.  Palliative care consulted.   Elevated ammonia level: Continue with  lactulose. Ammonia at 70  Atrial fibrillation: Chads 2 vas score of 5. Initially on eliquis, but with liver metastases and renal dysfunction, changed to IV heparin.  Started  coumadin. INR at 1.2.   diabetes mellitus controlled with renal disease: Normally on Glucotrol. A1c only at 5.6.   Code Status: Full code  Family Communication: wife at the bedside   Disposition Plan: Continue in-hospital until radiation therapy set, breathing felt to be stabilized. Anticipate discharge in next 1-2 days. Needs PT evaluation.    Consultants:  Oncology  Critical care  Procedures:  None  Antibiotics:  Aztreonam 8/26-8/27  Levaquin 8/26-present (changed to by mouth on 8/29)  Vancomycin 8/26-8/27   Objective: BP 113/64 mmHg  Pulse 65  Temp(Src) 98.1 F (36.7 C) (Oral)  Resp 18  Ht '5\' 9"'$  (1.753 m)  Wt 116.4 kg (256 lb 9.9 oz)  BMI 37.88 kg/m2  SpO2 99%  Intake/Output Summary (Last 24 hours) at 09/16/14 0819 Last data filed at 09/15/14 2309  Gross per 24 hour  Intake    240 ml  Output   2400 ml  Net  -2160 ml   Filed Weights   09/14/14 0500 09/15/14 0426 09/16/14 0550  Weight: 96.9 kg (213 lb 10 oz) 113.2 kg (249 lb 9 oz) 116.4 kg (256 lb 9.9 oz)    Exam:   General:  Alert and oriented 3, no acute distress  Cardiovascular: Regular rate and rhythm, S1-S2  Respiratory:  decreased breath sounds throughout, wheezing resolved  Abdomen: Soft,  distended, and nontender, positive bowel sounds  Musculoskeletal: No clubbing or cyanosis, trace edema   Data Reviewed: Basic Metabolic Panel:  Recent Labs Lab 09/11/14 0500 09/13/14 0517 09/14/14 0535 09/15/14 0507  09/16/14 0413  NA 136 136 133* 134* 135  K 4.0 4.9 4.2 4.1 4.4  CL 97* 97* 92* 90* 91*  CO2 12* 12* 14* 16* 17*  GLUCOSE 118* 77 76 83 93  BUN 80* 118* 115* 100* 96*  CREATININE 2.23* 2.41* 2.43* 2.13* 1.97*  CALCIUM 9.4 9.4 9.4 9.6 10.0   Liver Function Tests: No results for input(s): AST, ALT, ALKPHOS, BILITOT, PROT, ALBUMIN in the last 168 hours. No results for input(s): LIPASE, AMYLASE in the last 168 hours.  Recent Labs Lab 09/14/14 1630 09/16/14 0413  AMMONIA 83* 70*   CBC:  Recent Labs Lab 09/12/14 0250 09/13/14 0517 09/14/14 0535 09/15/14 0507 09/16/14 0413  WBC 21.3* 21.8* 17.4* 18.1* 17.5*  HGB 10.5* 10.2* 10.4* 10.3* 10.0*  HCT 32.3* 30.7* 31.0* 31.0* 30.8*  MCV 73.1* 71.9* 71.8* 72.4* 73.0*  PLT 204 224 214 201 170   Cardiac Enzymes:    Recent Labs Lab 09/09/14 1220  TROPONINI 0.03   BNP (last 3 results) No results for input(s): BNP in the last 8760 hours.  ProBNP (last 3 results) No results for input(s): PROBNP in the last 8760 hours.  CBG:  Recent Labs Lab 09/15/14 0749 09/15/14 1231 09/15/14 1646 09/15/14 2104 09/16/14 0730  GLUCAP 105* 127* 134* 133* 99    Recent Results (from the past 240 hour(s))  Culture, blood (routine x 2)     Status: None   Collection Time: 09/08/14  4:27 PM  Result Value Ref Range Status   Specimen Description BLOOD RIGHT ANTECUBITAL  Final   Special Requests BOTTLES DRAWN AEROBIC AND ANAEROBIC 5 CC EA  Final   Culture   Final    NO GROWTH 5 DAYS Performed at [facility]    Report Status 09/13/2014 FINAL  Final  Culture, blood (routine x 2)     Status: None   Collection Time: 09/08/14  4:27 PM  Result Value Ref Range Status   Specimen Description BLOOD RIGHT HAND  Final   Special Requests BOTTLES DRAWN AEROBIC AND ANAEROBIC 5 CC EA  Final   Culture   Final    NO GROWTH 5 DAYS Performed at [facility]    Report Status 09/13/2014 FINAL  Final  Urine culture     Status: None    Collection Time: 09/08/14  5:39 PM  Result Value Ref Range Status   Specimen Description URINE, CLEAN CATCH  Final   Special Requests Normal  Final   Culture   Final    8,000 COLONIES/mL INSIGNIFICANT GROWTH Performed at [facility]    Report Status 09/10/2014 FINAL  Final  MRSA PCR Screening     Status: None   Collection Time: 09/08/14  9:02 PM  Result Value Ref Range Status   MRSA by PCR NEGATIVE NEGATIVE Final    Comment:        The GeneXpert MRSA Assay (FDA approved for NASAL specimens only), is one component of a comprehensive MRSA colonization surveillance program. It is not intended to diagnose MRSA infection nor to guide or monitor treatment for MRSA infections.      Studies: No results found.  Scheduled Meds: . carvedilol  12.5 mg Oral BID WC  . feeding supplement (ENSURE ENLIVE)  237 mL Oral BID BM  . furosemide  40 mg Oral BID  . guaiFENesin  600 mg Oral BID  . insulin aspart  0-5 Units Subcutaneous QHS  . insulin aspart  0-9 Units Subcutaneous TID WC  . ipratropium-albuterol  3 mL Nebulization TID  . lactulose  20 g Oral TID  . levofloxacin  750 mg Oral Q48H  . polyethylene glycol  17 g Oral Daily  . sodium bicarbonate  1,300 mg Oral TID  . Warfarin - Pharmacist Dosing Inpatient   Does not apply q1800    Continuous Infusions: . heparin 1,100 Units/hr (09/15/14 1603)     Time spent: 25 minutes  [place], [place] [facility] Pager [phone]. If 7PM-7AM, please contact night-coverage at [email or web address], password [facility] 09/16/2014, 8:19 AM  LOS: 8 days

## 2014-09-16 NOTE — Progress Notes (Signed)
Pt refused cpap tonight.  Pt is aware that RT is available all night should he change his mind.

## 2014-09-16 NOTE — Progress Notes (Addendum)
ANTICOAGULATION CONSULT NOTE -Follow Up Consult  Pharmacy Consult for Heparin-->Coumadin Indication: atrial fibrillation  Allergies  Allergen Reactions  . Amoxicillin Itching and Rash    Patient Measurements: Height: '5\' 9"'$  (175.3 cm) Weight: 256 lb 9.9 oz (116.4 kg) IBW/kg (Calculated) : 70.7 Heparin Dosing Weight: 89.3 kg  Vital Signs: Temp: 97.4 F (36.3 C) (09/03 1011) Temp Source: Oral (09/03 1011) BP: 119/69 mmHg (09/03 1011) Pulse Rate: 85 (09/03 1011)  Labs:  Recent Labs  09/14/14 0535 09/15/14 0507 09/16/14 0413  HGB 10.4* 10.3* 10.0*  HCT 31.0* 31.0* 30.8*  PLT 214 201 170  LABPROT 15.1 14.5 15.6*  INR 1.17 1.11 1.22  HEPARINUNFRC 0.40 0.45 0.49  CREATININE 2.43* 2.13* 1.97*    Estimated Creatinine Clearance: 37.6 mL/min (by C-G formula based on Cr of 1.97).   Medical History: Past Medical History  Diagnosis Date  . High blood pressure   . Irregular heart rhythm   . Asthma   . Diabetes mellitus   . High cholesterol   . Kidney disease   . Sleep apnea   . Sickle cell trait   . A-fib   . Complete heart block   . Pacemaker     Medications:  Scheduled:  . carvedilol  12.5 mg Oral BID WC  . feeding supplement (ENSURE ENLIVE)  237 mL Oral BID BM  . furosemide  40 mg Oral BID  . guaiFENesin  600 mg Oral BID  . insulin aspart  0-5 Units Subcutaneous QHS  . insulin aspart  0-9 Units Subcutaneous TID WC  . ipratropium-albuterol  3 mL Nebulization BID  . lactulose  20 g Oral TID  . levofloxacin  750 mg Oral Q48H  . polyethylene glycol  17 g Oral Daily  . sodium bicarbonate  1,300 mg Oral TID  . warfarin  5 mg Oral ONCE-1800  . Warfarin - Pharmacist Dosing Inpatient   Does not apply q1800   Infusions:  . heparin 1,100 Units/hr (09/15/14 1603)    Assessment: 81 year old male recently diagnosed with left lower lobe lung mass with metastasis to the liver, s/p biopsy on 09/05/14.  He was admitted 09/08/14 with worsening shortness of breath and  weakness.    Patient was taking Apixaban (Eliquis) for chronic Afib.  Given ARF and compromised hepatic function, CCM has consulted pharmacy to transition to IV heparin.  Now starting Warfarin. Patient was on Warfarin '1mg'$  ~ 39yrago prior to switching to Apixaban.  09/16/2014:  INR subtherapeutic at 1.22 after warfarin 3 mg x 3 days  HL therapeutic this morning at 0.49 on heparin infusion at 1100 units/hr  H/H stable  Pltc WNL  No overt bleeding noted/documented  Diet: heart healthy diet + Ensure Enlive supplements  Drug Interactions with Warfarin: Levofloxacin can increase INR  Goal of Therapy:  Heparin level 0.3-0.7 units/ml  Monitor platelets by anticoagulation protocol: Yes  INR 2-3   Plan:   Continue heparin infusion @ 1100 units/hr  Check daily heparin level & CBC while on heparin  Warfarin 5 mg PO x 1 today  Daily PT/INR  Monitor closely for s/s of bleeding  Warfarin education completed 9/1   JLindell Spar PharmD, BCPS Pager: [phone]/03/2014 1:25 PM

## 2014-09-16 NOTE — Progress Notes (Signed)
Occupational Therapy Treatment Patient Details Name: Bradley Rasmussen MRN: [id number] DOB: November 22, 1931 Today's Date: 09/16/2014    History of present illness 81 yo male admitted with SOB, A fib, weakness. Hx of HTN, DM, sleep apnea, pacemaker, sickle cell trait, lung cancer with mets to liver.    OT comments  Pt with good participation!  Follow Up Recommendations  Home health OT;Supervision/Assistance - 24 hour    Equipment Recommendations  None recommended by OT    Recommendations for Other Services      Precautions / Restrictions Precautions Precautions: Fall Restrictions Weight Bearing Restrictions: No       Mobility Bed Mobility Overal bed mobility: Needs Assistance Bed Mobility: Supine to Sit     Supine to sit: Supervision        Transfers Overall transfer level: Needs assistance Equipment used: Rolling walker (2 wheeled) Transfers: Sit to/from [facility] Sit to Stand: Min assist Stand pivot transfers: Min assist       General transfer comment: Assist to rise, stabilize, control descent. VCs for safety, hand placement.    Balance                                   ADL Overall ADL's : Needs assistance/impaired                         Toilet Transfer: Moderate assistance;RW Toilet Transfer Details (indicate cue type and reason): sit to stand 5 times in prep for increased I with toileting         Functional mobility during ADLs: Cueing for safety;Cueing for sequencing;Rolling walker;Moderate assistance;Minimal assistance General ADL Comments: VC for breathing technique- also using incentive spirometer in sitting                Cognition   Behavior During Therapy: WFL for tasks assessed/performed Overall Cognitive Status: Within Functional Limits for tasks assessed                                    Pertinent Vitals/ Pain       Pain Assessment: No/denies pain            Progress Toward  Goals  OT Goals(current goals can now be found in the care plan section)  Progress towards OT goals: Progressing toward goals     Plan Discharge plan remains appropriate       End of Session Equipment Utilized During Treatment: Rolling walker   Activity Tolerance Patient tolerated treatment well   Patient Left in chair;with call bell/phone within reach;with family/visitor present   Nurse Communication Mobility status        Time: 4917-9150 OT Time Calculation (min): 25 min  Charges: OT General Charges $OT Visit: 1 Procedure OT Treatments $Self Care/Home Management : 23-37 mins  Aquan Kope, Edwena Felty D 09/16/2014, 2:03 PM

## 2014-09-17 DIAGNOSIS — E872 Acidosis: Secondary | ICD-10-CM

## 2014-09-17 LAB — BASIC METABOLIC PANEL
BUN: 94 mg/dL — ABNORMAL HIGH (ref 6–20)
CALCIUM: 10 mg/dL (ref 8.9–10.3)
CO2: 19 mmol/L — ABNORMAL LOW (ref 22–32)
CREATININE: 2.04 mg/dL — AB (ref 0.61–1.24)
Chloride: 90 mmol/L — ABNORMAL LOW (ref 101–111)
GFR, EST AFRICAN AMERICAN: 34 mL/min — AB (ref >60)
GFR, EST NON AFRICAN AMERICAN: 29 mL/min — AB (ref >60)
Glucose, Bld: 86 mg/dL (ref 65–99)
Potassium: 4.6 mmol/L (ref 3.5–5.1)
Sodium: 133 mmol/L — ABNORMAL LOW (ref 135–145)

## 2014-09-17 LAB — GLUCOSE, CAPILLARY
GLUCOSE-CAPILLARY: 83 mg/dL (ref 65–99)
GLUCOSE-CAPILLARY: 107 mg/dL — AB (ref 65–99)
Glucose-Capillary: 102 mg/dL — ABNORMAL HIGH (ref 65–99)
Glucose-Capillary: 96 mg/dL (ref 65–99)

## 2014-09-17 LAB — HEPARIN LEVEL (UNFRACTIONATED): HEPARIN UNFRACTIONATED: 0.4 [IU]/mL (ref 0.30–0.70)

## 2014-09-17 LAB — CBC
HEMATOCRIT: 31.6 % — AB (ref 39.0–52.0)
Hemoglobin: 10.3 g/dL — ABNORMAL LOW (ref 13.0–17.0)
MCH: 24.1 pg — AB (ref 26.0–34.0)
MCHC: 32.6 g/dL (ref 30.0–36.0)
MCV: 74 fL — AB (ref 78.0–100.0)
PLATELETS: 152 10*3/uL (ref 150–400)
RBC: 4.27 MIL/uL (ref 4.22–5.81)
RDW: 18.5 % — AB (ref 11.5–15.5)
WBC: 17.9 10*3/uL — ABNORMAL HIGH (ref 4.0–10.5)

## 2014-09-17 LAB — AMMONIA: Ammonia: 62 umol/L — ABNORMAL HIGH (ref 9–35)

## 2014-09-17 LAB — PROTIME-INR
INR: 1.19 (ref 0.00–1.49)
PROTHROMBIN TIME: 15.3 s — AB (ref 11.6–15.2)

## 2014-09-17 MED ORDER — WARFARIN SODIUM 7.5 MG PO TABS
7.5000 mg | ORAL_TABLET | Freq: Once | ORAL | Status: AC
  Administered 2014-09-17: 7.5 mg via ORAL
  Filled 2014-09-17 (×3): qty 1

## 2014-09-17 MED ORDER — ENOXAPARIN SODIUM 120 MG/0.8ML ~~LOC~~ SOLN
1.0000 mg/kg | Freq: Two times a day (BID) | SUBCUTANEOUS | Status: DC
  Administered 2014-09-17 (×2): 115 mg via SUBCUTANEOUS
  Filled 2014-09-17 (×3): qty 0.8

## 2014-09-17 MED ORDER — ENOXAPARIN (LOVENOX) PATIENT EDUCATION KIT
PACK | Freq: Once | Status: AC
  Administered 2014-09-17: 12:00:00
  Filled 2014-09-17: qty 1

## 2014-09-17 NOTE — Progress Notes (Signed)
Pt refused cpap tonight.  Pt is aware that RT is available all night should he change his mind.

## 2014-09-17 NOTE — Progress Notes (Addendum)
ANTICOAGULATION CONSULT NOTE -Follow Up Consult  Pharmacy Consult for Heparin--> Enoxaparin, Warfarin Indication: atrial fibrillation  Allergies  Allergen Reactions  . Amoxicillin Itching and Rash    Patient Measurements: Height: '5\' 9"'$  (175.3 cm) Weight: 256 lb 2.8 oz (116.2 kg) IBW/kg (Calculated) : 70.7 Heparin Dosing Weight: 89.3 kg  Vital Signs: Temp: 98 F (36.7 C) (09/04 0616) Temp Source: Oral (09/04 0616) BP: 106/60 mmHg (09/04 0956) Pulse Rate: 85 (09/04 0956)  Labs:  Recent Labs  09/15/14 0507 09/16/14 0413 09/17/14 0526  HGB 10.3* 10.0*  --   HCT 31.0* 30.8*  --   PLT 201 170  --   LABPROT 14.5 15.6* 15.3*  INR 1.11 1.22 1.19  HEPARINUNFRC 0.45 0.49 0.40  CREATININE 2.13* 1.97* 2.04*    Estimated Creatinine Clearance: 36.3 mL/min (by C-G formula based on Cr of 2.04).   Medical History: Past Medical History  Diagnosis Date  . High blood pressure   . Irregular heart rhythm   . Asthma   . Diabetes mellitus   . High cholesterol   . Kidney disease   . Sleep apnea   . Sickle cell trait   . A-fib   . Complete heart block   . Pacemaker     Medications:  Scheduled:  . carvedilol  12.5 mg Oral BID WC  . enoxaparin (LOVENOX) injection  1 mg/kg Subcutaneous Q12H  . enoxaparin   Does not apply Once  . feeding supplement (ENSURE ENLIVE)  237 mL Oral BID BM  . furosemide  40 mg Oral BID  . guaiFENesin  600 mg Oral BID  . insulin aspart  0-5 Units Subcutaneous QHS  . insulin aspart  0-9 Units Subcutaneous TID WC  . ipratropium-albuterol  3 mL Nebulization BID  . lactulose  20 g Oral TID  . levofloxacin  750 mg Oral Q48H  . polyethylene glycol  17 g Oral Daily  . sodium bicarbonate  1,300 mg Oral TID  . warfarin  7.5 mg Oral ONCE-1800  . Warfarin - Pharmacist Dosing Inpatient   Does not apply q1800   Infusions:  . heparin 1,100 Units/hr (09/16/14 1734)    Assessment: 81 year old male recently diagnosed with left lower lobe lung mass with  metastasis to the liver, s/p biopsy on 09/05/14.  He was admitted 09/08/14 with worsening shortness of breath and weakness.    Patient was taking Apixaban (Eliquis) PTA for chronic Afib.  Given ARF and compromised hepatic function, CCM consulted pharmacy to transition to IV heparin. Warfarin started on 8/31.  Patient was on Warfarin '1mg'$  ~ 23yrago prior to switching to Apixaban. Today, pharmacy asked to transition patient from heparin to enoxaparin for bridging to warfarin.  09/17/2014:  INR subtherapeutic, decreased to 1.19 despite increased dose on 9/3  H/H stable, Pltc WNL  SCr increased to 2.04 with CrCl ~ 36 ml/min  No overt bleeding noted/documented  Diet: heart healthy diet + Ensure Enlive supplements  Drug Interactions with Warfarin: Levofloxacin can increase INR  Goal of Therapy:  Anti-Xa level 0.6-1 units/ml 4hrs after LMWH dose given  Monitor platelets by anticoagulation protocol: Yes  INR 2-3   Plan:   D/C heparin infusion at 1030.  1 hour after heparin infusion d/c'ed, start Enoxaparin 1 mg/kg (115 mg) SQ q12h.  Given elevated SCr/decreased renal function, would consider checking anti-Xa level on 9/5.  Warfarin 7.5 mg PO x 1 today.  Daily PT/INR  CBC at least q72h.  Monitor closely for s/s of bleeding  Warfarin education completed 9/1   Lindell Spar, PharmD, BCPS Pager: [phone] 09/17/2014 10:12 AM

## 2014-09-17 NOTE — Progress Notes (Signed)
PROGRESS NOTE  Bradley Rasmussen PFX:[id number] DOB: 1931/04/02 DOA: 09/08/2014 PCP: Marco Collie, MD  HPI/Recap of past 53 hours: 81 year old male with past mental history of recently diagnosed metastatic lung cancer admitted on the evening of 8/26 with progressively worsening dyspnea on exertion and acute renal failure. Initially thought to be in septic shock from postobstructive pneumonia. Seen by critical care.  Critical care clarified that his lactic acidosis was not from sepsis, but from his liver metastases. Patient's breathing significantly improved over the initial 24 hours, and he was transferred to the floor.  Over the past few days, breathing appeared more labored.swallow eval checked and found to be unrevealing.  IVs stopped and patient started on diuretic.  Has started radiation therapy for palliative options. Pulmonary discontinued steroids.  He is feeling ok, no worsening dyspnea.   Assessment/Plan:  Acute Hypoxic Respiratory Failure:  Shortness of breath/dyspnea on exertion:  Likely from Postobstructive lung mass  plus underlying COPD from smoking. Continue nebulizers.   Steroids stopped Appreciate pulmonary assistance. On Levaquin. WBC trending down.  Repeat chest x ray in am with persistent lung mass. No pulmonary edema/  Continue with oral lasix.   Acute kidney injury in the setting of stage III chronic kidney disease: Likely from hypotension from dehydration initially. Start sodium bicarb for metabolic acidosis.  Change lasix to oral. Good urine out put.  Renal function stable. BUN trending down. Acidosis improving with bicarb tablet.    Hyperkalemia: Noted on admission.status post Kayexalate. Now resolved.  Lactic acidosis: Not sepsis. Lactic acidosis secondary liver metastases, renal failure.   Chronic systolic CHF. Monitoring daily weights, significant increased today. chest xray 9-01 no pulmonary edema. Change lasix to oral. Weight  99--97--96-weight up today. Would  continue with same dose of lasix.   Metastatic lung carcinoma involving liver with unknown primary site: Oncology seeing.  Palliative radiation recommended. Started radiation treatment this admission.  Palliative care consulted. MOST form was completed. Patient would want IV fluids, IV antibiotics.   Elevated ammonia level: Continue with  lactulose. Ammonia at 62 down from 70  Atrial fibrillation: Chads 2 vas score of 5. Initially on eliquis, but with liver metastases and renal dysfunction, changed to IV heparin.  Started  coumadin. INR at 1.1. Will transition to lovenox./    diabetes mellitus controlled with renal disease: Normally on Glucotrol. A1c only at 5.6.   Code Status: DNR  Family Communication: wife at the bedside   Disposition Plan: Continue in-hospital until radiation therapy set, breathing felt to be stabilized. Anticipate discharge in next 1-2 days. Needs PT evaluation.    Consultants:  Oncology  Critical care  Procedures:  None  Antibiotics:  Aztreonam 8/26-8/27  Levaquin 8/26-present (changed to by mouth on 8/29)  Vancomycin 8/26-8/27   Objective: BP 122/62 mmHg  Pulse 84  Temp(Src) 98 F (36.7 C) (Oral)  Resp 18  Ht '5\' 9"'$  (1.753 m)  Wt 116.2 kg (256 lb 2.8 oz)  BMI 37.81 kg/m2  SpO2 98%  Intake/Output Summary (Last 24 hours) at 09/17/14 0909 Last data filed at 09/17/14 3299  Gross per 24 hour  Intake   1304 ml  Output   3250 ml  Net  -1946 ml   Filed Weights   09/15/14 0426 09/16/14 0550 09/17/14 0616  Weight: 113.2 kg (249 lb 9 oz) 116.4 kg (256 lb 9.9 oz) 116.2 kg (256 lb 2.8 oz)    Exam:   General:  Alert and oriented 3, no acute distress  Cardiovascular: Regular rate and rhythm,  S1-S2  Respiratory:  decreased breath sounds throughout, wheezing resolved  Abdomen: Soft, distended, and nontender, positive bowel sounds  Musculoskeletal: No clubbing or cyanosis, trace edema   Data Reviewed: Basic Metabolic Panel:  Recent  Labs Lab 09/13/14 0517 09/14/14 0535 09/15/14 0507 09/16/14 0413 09/17/14 0526  NA 136 133* 134* 135 133*  K 4.9 4.2 4.1 4.4 4.6  CL 97* 92* 90* 91* 90*  CO2 12* 14* 16* 17* 19*  GLUCOSE 77 76 83 93 86  BUN 118* 115* 100* 96* 94*  CREATININE 2.41* 2.43* 2.13* 1.97* 2.04*  CALCIUM 9.4 9.4 9.6 10.0 10.0   Liver Function Tests: No results for input(s): AST, ALT, ALKPHOS, BILITOT, PROT, ALBUMIN in the last 168 hours. No results for input(s): LIPASE, AMYLASE in the last 168 hours.  Recent Labs Lab 09/14/14 1630 09/16/14 0413 09/17/14 0800  AMMONIA 83* 70* 62*   CBC:  Recent Labs Lab 09/12/14 0250 09/13/14 0517 09/14/14 0535 09/15/14 0507 09/16/14 0413  WBC 21.3* 21.8* 17.4* 18.1* 17.5*  HGB 10.5* 10.2* 10.4* 10.3* 10.0*  HCT 32.3* 30.7* 31.0* 31.0* 30.8*  MCV 73.1* 71.9* 71.8* 72.4* 73.0*  PLT 204 224 214 201 170   Cardiac Enzymes:   No results for input(s): CKTOTAL, CKMB, CKMBINDEX, TROPONINI in the last 168 hours. BNP (last 3 results) No results for input(s): BNP in the last 8760 hours.  ProBNP (last 3 results) No results for input(s): PROBNP in the last 8760 hours.  CBG:  Recent Labs Lab 09/16/14 0730 09/16/14 1119 09/16/14 1607 09/16/14 2152 09/17/14 0749  GLUCAP 99 134* 126* 92 83    Recent Results (from the past 240 hour(s))  Culture, blood (routine x 2)     Status: None   Collection Time: 09/08/14  4:27 PM  Result Value Ref Range Status   Specimen Description BLOOD RIGHT ANTECUBITAL  Final   Special Requests BOTTLES DRAWN AEROBIC AND ANAEROBIC 5 CC EA  Final   Culture   Final    NO GROWTH 5 DAYS Performed at [facility]    Report Status 09/13/2014 FINAL  Final  Culture, blood (routine x 2)     Status: None   Collection Time: 09/08/14  4:27 PM  Result Value Ref Range Status   Specimen Description BLOOD RIGHT HAND  Final   Special Requests BOTTLES DRAWN AEROBIC AND ANAEROBIC 5 CC EA  Final   Culture   Final    NO GROWTH 5  DAYS Performed at [facility]    Report Status 09/13/2014 FINAL  Final  Urine culture     Status: None   Collection Time: 09/08/14  5:39 PM  Result Value Ref Range Status   Specimen Description URINE, CLEAN CATCH  Final   Special Requests Normal  Final   Culture   Final    8,000 COLONIES/mL INSIGNIFICANT GROWTH Performed at [facility]    Report Status 09/10/2014 FINAL  Final  MRSA PCR Screening     Status: None   Collection Time: 09/08/14  9:02 PM  Result Value Ref Range Status   MRSA by PCR NEGATIVE NEGATIVE Final    Comment:        The GeneXpert MRSA Assay (FDA approved for NASAL specimens only), is one component of a comprehensive MRSA colonization surveillance program. It is not intended to diagnose MRSA infection nor to guide or monitor treatment for MRSA infections.   Culture, sputum-assessment     Status: None   Collection Time: 09/16/14 10:51 PM  Result Value Ref Range Status   Specimen Description SPUTUM  Final   Special Requests NONE  Final   Sputum evaluation   Final    THIS SPECIMEN IS ACCEPTABLE. RESPIRATORY CULTURE REPORT TO FOLLOW.   Report Status 09/16/2014 FINAL  Final     Studies: No results found.  Scheduled Meds: . carvedilol  12.5 mg Oral BID WC  . feeding supplement (ENSURE ENLIVE)  237 mL Oral BID BM  . furosemide  40 mg Oral BID  . guaiFENesin  600 mg Oral BID  . insulin aspart  0-5 Units Subcutaneous QHS  . insulin aspart  0-9 Units Subcutaneous TID WC  . ipratropium-albuterol  3 mL Nebulization BID  . lactulose  20 g Oral TID  . levofloxacin  750 mg Oral Q48H  . polyethylene glycol  17 g Oral Daily  . sodium bicarbonate  1,300 mg Oral TID  . Warfarin - Pharmacist Dosing Inpatient   Does not apply q1800    Continuous Infusions: . heparin 1,100 Units/hr (09/16/14 1734)     Time spent: 25 minutes  Annalia Metzger, [facility] Pager [phone]. If 7PM-7AM, please contact night-coverage at [email or web address],  password [facility] 09/17/2014, 9:09 AM  LOS: 9 days

## 2014-09-18 LAB — CBC
HCT: 31.7 % — ABNORMAL LOW (ref 39.0–52.0)
HEMOGLOBIN: 9.8 g/dL — AB (ref 13.0–17.0)
MCH: 23.5 pg — ABNORMAL LOW (ref 26.0–34.0)
MCHC: 30.9 g/dL (ref 30.0–36.0)
MCV: 76 fL — ABNORMAL LOW (ref 78.0–100.0)
Platelets: 154 10*3/uL (ref 150–400)
RBC: 4.17 MIL/uL — ABNORMAL LOW (ref 4.22–5.81)
RDW: 19.3 % — AB (ref 11.5–15.5)
WBC: 17.3 10*3/uL — AB (ref 4.0–10.5)

## 2014-09-18 LAB — GLUCOSE, CAPILLARY
Glucose-Capillary: 86 mg/dL (ref 65–99)
Glucose-Capillary: 99 mg/dL (ref 65–99)

## 2014-09-18 LAB — AMMONIA: AMMONIA: 79 umol/L — AB (ref 9–35)

## 2014-09-18 LAB — PROTIME-INR
INR: 1.37 (ref 0.00–1.49)
Prothrombin Time: 17 seconds — ABNORMAL HIGH (ref 11.6–15.2)

## 2014-09-18 LAB — CREATININE, SERUM
CREATININE: 1.93 mg/dL — AB (ref 0.61–1.24)
GFR, EST AFRICAN AMERICAN: 36 mL/min — AB (ref >60)
GFR, EST NON AFRICAN AMERICAN: 31 mL/min — AB (ref >60)

## 2014-09-18 MED ORDER — FUROSEMIDE 40 MG PO TABS: 40.0000 mg | ORAL_TABLET | Freq: Two times a day (BID) | ORAL | Status: AC

## 2014-09-18 MED ORDER — SODIUM BICARBONATE 650 MG PO TABS: 1300.0000 mg | ORAL_TABLET | Freq: Three times a day (TID) | ORAL | Status: AC

## 2014-09-18 MED ORDER — WARFARIN SODIUM 7.5 MG PO TABS
7.5000 mg | ORAL_TABLET | Freq: Once | ORAL | Status: DC
  Filled 2014-09-18: qty 1

## 2014-09-18 MED ORDER — WARFARIN SODIUM 5 MG PO TABS: 7.5000 mg | ORAL_TABLET | Freq: Every day | ORAL | Status: AC

## 2014-09-18 MED ORDER — IPRATROPIUM-ALBUTEROL 0.5-2.5 (3) MG/3ML IN SOLN: 3.0000 mL | Freq: Two times a day (BID) | RESPIRATORY_TRACT | Status: AC

## 2014-09-18 MED ORDER — LACTULOSE 10 GM/15ML PO SOLN: 30.0000 g | Freq: Three times a day (TID) | ORAL | Status: AC

## 2014-09-18 MED ORDER — LEVOFLOXACIN 750 MG PO TABS: 750.0000 mg | ORAL_TABLET | ORAL | Status: AC

## 2014-09-18 MED ORDER — ENSURE ENLIVE PO LIQD: 237.0000 mL | Freq: Two times a day (BID) | ORAL | Status: AC

## 2014-09-18 MED ORDER — ENOXAPARIN SODIUM 120 MG/0.8ML ~~LOC~~ SOLN: 115.0000 mg | Freq: Two times a day (BID) | SUBCUTANEOUS | Status: DC

## 2014-09-18 MED ORDER — LACTULOSE 10 GM/15ML PO SOLN
30.0000 g | Freq: Three times a day (TID) | ORAL | Status: DC
  Administered 2014-09-18: 30 g via ORAL
  Filled 2014-09-18 (×2): qty 45

## 2014-09-18 MED ORDER — ENOXAPARIN SODIUM 100 MG/ML ~~LOC~~ SOLN
100.0000 mg | Freq: Two times a day (BID) | SUBCUTANEOUS | Status: DC
  Administered 2014-09-18: 100 mg via SUBCUTANEOUS
  Filled 2014-09-18: qty 1

## 2014-09-18 MED ORDER — GUAIFENESIN ER 600 MG PO TB12: 600.0000 mg | ORAL_TABLET | Freq: Two times a day (BID) | ORAL | Status: AC

## 2014-09-18 MED ORDER — OXYCODONE HCL 5 MG PO TABS: 5.0000 mg | ORAL_TABLET | Freq: Four times a day (QID) | ORAL | Status: AC | PRN

## 2014-09-18 MED ORDER — ALBUTEROL SULFATE (2.5 MG/3ML) 0.083% IN NEBU: 2.5000 mg | INHALATION_SOLUTION | RESPIRATORY_TRACT | Status: AC | PRN

## 2014-09-18 MED ORDER — ENOXAPARIN SODIUM 100 MG/ML ~~LOC~~ SOLN: 100.0000 mg | Freq: Two times a day (BID) | SUBCUTANEOUS | Status: AC

## 2014-09-18 NOTE — Discharge Summary (Signed)
Physician Discharge Summary  Bradley Rasmussen YQI:[id number] DOB: [birth year] DOA: 09/08/2014  PCP: Bradley Collie, MD  Admit date: 09/08/2014 Discharge date: 09/18/2014  Time spent: 35 minutes  Recommendations for Outpatient Follow-up:  1. Follow up for radiation treatment  At cancer center.  2. Follow up with Bradley Rasmussen for further care of metastatic lung Cancer.  3. Needs Bmet to follow renal function.  4. Needs INR adjust coumadin as needed.   Discharge Diagnoses:    Metastatic carcinoma involving liver with unknown primary site   Metastatic primary lung cancer   Shortness of breath   OSA on CPAP   Lung mass   Abdominal pain   Liver mass   Acute renal failure   Hyperkalemia   Hypokalemia   Lactic acidosis   Systolic CHF, chronic   Malignant neoplasm of lower lobe of left lung   Encounter for palliative care   Malnutrition of moderate degree   Dyspnea   Metastatic lung cancer (metastasis from lung to other site)   Goals of care, counseling/discussion   Discharge Condition: stable.   Diet recommendation: heart healthy  Filed Weights   09/16/14 0550 09/17/14 0616 09/18/14 0300  Weight: 116.4 kg (256 lb 9.9 oz) 116.2 kg (256 lb 2.8 oz) 95.1 kg (209 lb 10.5 oz)    History of present illness:  Bradley Rasmussen is a 81 y.o. male  has a past medical history of High blood pressure; Irregular heart rhythm; Asthma; Diabetes mellitus; High cholesterol; Kidney disease; Sleep apnea; and Sickle cell trait.   2 Weeks ago patient was diagnosed with lumbar mass with metastasis to the liver. Patient has 40 pack years smoking history. Patient has been followed by pulmonology. Has not established care with oncology yet.  On 23 of August patient undergone liver lesion biopsy results showing poorly differentiated malignant lesion.  Presented with progressively worsening shortness of breath and generalized weakness. On the phone with pulmonology was having hard time catching his breath. Patient was  instructed to come to emerge department.  Patient reports gradual worsening in shortness of breath. He has been having some abdominal pain and reports sensation of food getting stuck. The sensation is both with liquids and solids. This has been getting worse over past few days. He has been eating and drinking less due to this pain. Denies pain in the area of Biopsy. Denies chest pain, no pain with respiration. NO fever he has had persistent cough.  Family endorsing stuggering gait. Patient reports diplopia  On arrival to emergency department patient was noted to have worsening renal function, potassium elevated 5.2 white blood cell count elevated at 14 and lactic acid of 13.4 case was discussed by ER physician with [facility] who recommended admission to stepdown and fluid resuscitation.  Patient has long-standing history of asthma. Diet controlled diabetes and chronic kidney disease Baseline creatinine 1.6 Hospitalist was called for admission for dehydration Lactic acidosis, dehydration and acute on chronic renal failure  Hospital Course:  81 year old male with past mental history of recently diagnosed metastatic lung cancer admitted on the evening of 8/26 with progressively worsening dyspnea on exertion and acute renal failure. Initially thought to be in septic shock from postobstructive pneumonia. Seen by critical care. Critical care clarified that his lactic acidosis was not from sepsis, but from his liver metastases. He is feeling ok, no worsening dyspnea.   Acute Hypoxic Respiratory Failure:  Shortness of breath/dyspnea on exertion: Likely from Postobstructive lung mass plus underlying COPD from smoking. Continue nebulizers. Steroids stopped Appreciate  pulmonary assistance. On Levaquin. WBC trending down.  Repeat chest x ray in am with persistent lung mass. No pulmonary edema/  Continue with oral lasix.   Acute kidney injury in the setting of stage III chronic kidney disease: Likely from  hypotension from dehydration initially. Start sodium bicarb for metabolic acidosis.  Change lasix to oral. Good urine out put.  Renal function stable. BUN trending down. Acidosis improving with bicarb tablet.    Hyperkalemia: Noted on admission.status post Kayexalate. Now resolved.  Lactic acidosis: Not sepsis. Lactic acidosis secondary liver metastases, renal failure.   Chronic systolic CHF. Monitoring daily weights, significant increased today. chest xray 9-01 no pulmonary edema. Change lasix to oral. Weight 99--97--96-. Would continue with same dose of lasix.   Metastatic lung carcinoma involving liver with unknown primary site: Oncology seeing. Palliative radiation recommended. Started radiation treatment this admission.  Palliative care consulted. MOST form was completed. Patient would want IV fluids, IV antibiotics. Might need referral to palliative  Elevated ammonia level: Continue with lactulose. Ammonia at 62 down from 70 . Increased today. Patient is alert, oriented to place and time and situation. He wishes to go home , he understand that he has liver failure. I will increase lactulose TID>   Atrial fibrillation: Chads 2 vas score of 5. Initially on eliquis, but with liver metastases and renal dysfunction, changed to IV heparin.  Started coumadin. INR at 1.1. Discharge on lovenox and coumadin. [place] nurse to check lab 9-06  diabetes mellitus controlled with renal disease: Normally on Glucotrol. A1c only at 5.6. Hold glucotrol at discharge. CBG in the 90   Procedures:  Radiation treatments.   Consultations:  Radiation oncology   Palliative care  CCM  Discharge Exam: Filed Vitals:   09/18/14 0942  BP: 109/61  Pulse: 84  Temp:   Resp:     General: He is alert, no distress Cardiovascular: S 1, S 2 RRR Respiratory: crackles bases.   Discharge Instructions   Discharge Instructions    AMB Referral to [facility]    Complete by:  As directed    Patient currently in hospital. Please assign to [facility] for CHF disease management. Multiple co-morbidities. Consents obtained. Please call with questions. Marthenia Rolling, [place], [facility] Liaison-[phone]  Reason for consult:  Please assign to [facility] RNCM  Diagnoses of:   Heart Failure Cancer with Mets    Expected date of contact:  1-3 days (reserved for hospital discharges)          Current Discharge Medication List    START taking these medications   Details  albuterol (PROVENTIL) (2.5 MG/3ML) 0.083% nebulizer solution Take 3 mLs (2.5 mg total) by nebulization every 4 (four) hours as needed for wheezing or shortness of breath. Qty: 75 mL, Refills: 12    feeding supplement, ENSURE ENLIVE, (ENSURE ENLIVE) LIQD Take 237 mLs by mouth 2 (two) times daily between meals. Qty: 237 mL, Refills: 12    guaiFENesin (MUCINEX) 600 MG 12 hr tablet Take 1 tablet (600 mg total) by mouth 2 (two) times daily. Qty: 30 tablet, Refills: 0    ipratropium-albuterol (DUONEB) 0.5-2.5 (3) MG/3ML SOLN Take 3 mLs by nebulization 2 (two) times daily. Qty: 360 mL, Refills: 0    lactulose (CHRONULAC) 10 GM/15ML solution Take 45 mLs (30 g total) by mouth 3 (three) times daily. Qty: 240 mL, Refills: 0    levofloxacin (LEVAQUIN) 750 MG tablet Take 1 tablet (750 mg total) by mouth every other day.  Qty: 3 tablet, Refills: 0    sodium bicarbonate 650 MG tablet Take 2 tablets (1,300 mg total) by mouth 3 (three) times daily. Qty: 90 tablet, Refills: 0      CONTINUE these medications which have CHANGED   Details  furosemide (LASIX) 40 MG tablet Take 1 tablet (40 mg total) by mouth 2 (two) times daily. Qty: 30 tablet, Refills: 0    oxyCODONE (OXY IR/ROXICODONE) 5 MG immediate release tablet Take 1 tablet (5 mg total) by mouth every 6 (six) hours as needed (pain). Qty: 30 tablet, Refills: 0      CONTINUE these medications which have NOT CHANGED   Details  allopurinol  (ZYLOPRIM) 100 MG tablet Take 50 mg by mouth daily.     carvedilol (COREG) 12.5 MG tablet Take 12.5 mg by mouth 2 (two) times daily with a meal.    Cholecalciferol (D-3-5) 5000 UNITS capsule Take 5,000 Units by mouth daily.    docusate sodium (COLACE) 100 MG capsule Take 100 mg by mouth every 6 (six) hours as needed for mild constipation.    doxazosin (CARDURA) 8 MG tablet Take 8 mg by mouth daily.    finasteride (PROSCAR) 5 MG tablet Take 5 mg by mouth daily.    Respiratory Therapy Supplies (FLUTTER) DEVI Use as directed Qty: 1 each, Refills: 0      STOP taking these medications     apixaban (ELIQUIS) 2.5 MG TABS tablet      glipiZIDE (GLUCOTROL) 5 MG tablet      metolazone (ZAROXOLYN) 5 MG tablet      oxyCODONE-acetaminophen (PERCOCET/ROXICET) 5-325 MG per tablet      polyethylene glycol (MIRALAX / GLYCOLAX) packet      potassium chloride SA (K-DUR,KLOR-CON) 20 MEQ tablet      pravastatin (PRAVACHOL) 80 MG tablet        Allergies  Allergen Reactions  . Amoxicillin Itching and Rash   Follow-up Information    Follow up with HODGES,BETH, MD In 1 week.   Specialty:  Family Medicine   Contact information:   [address] [place] [phone]        The results of significant diagnostics from this hospitalization (including imaging, microbiology, ancillary and laboratory) are listed below for reference.    Significant Diagnostic Studies: Ct Abdomen Pelvis Wo Contrast  08/31/2014   CLINICAL DATA:  81 year old male with abdominal pain for the past month. Weight loss for the past several weeks.  EXAM: CT CHEST, ABDOMEN AND PELVIS WITHOUT CONTRAST  TECHNIQUE: Multidetector CT imaging of the chest, abdomen and pelvis was performed following the standard protocol without IV contrast.  COMPARISON:  Chest CT 07/25/2014.  FINDINGS: CT CHEST FINDINGS  Mediastinum/Lymph Nodes: Heart size is enlarged with biatrial dilatation. There is no significant  pericardial fluid, thickening or pericardial calcification. There is atherosclerosis of the thoracic aorta, the great vessels of the mediastinum and the coronary arteries, including calcified atherosclerotic plaque in the left main, left anterior descending, left circumflex and right coronary arteries. Mild calcifications of the aortic valve. Left-sided pacemaker device in position with lead tip terminating in the right ventricular apex. Multiple borderline enlarged and enlarged mediastinal lymph nodes are noted measuring up to 19 mm in short axis in the subcarinal nodal station and 18 mm in short axis in the right paratracheal nodal station. Fullness of soft tissues in the left hilar region suggesting underlying lymphadenopathy, poorly evaluated on today's noncontrast chest CT. Esophagus is unremarkable in appearance. No axillary lymphadenopathy.  Lungs/Pleura: Previously demonstrated atelectasis in the left lower lobe has progressed, now with complete collapse/ consolidation of the left lower lobe. There is abrupt cut off of the left lower lobe bronchus on image 27 of series 4, likely related to either extrinsic obstruction or endobronchial lesion. No definite pleural effusions. Patchy multifocal peribronchovascular ground-glass attenuation in the right upper and middle lobes is nonspecific, but favored to be infectious or inflammatory in etiology, with the largest ground-glass attenuation area measuring 11 x 8 mm in the lateral segment of the right middle lobe (image 22 of series 4). These are more apparent than the prior study.  Musculoskeletal/Soft Tissues: 1.7 cm lytic lesion in the left side of the manubrium (image 13 of series 4). Expansile lesion with moth-eaten appearance in the lateral aspect of the left seventh rib, with possible nondisplaced pathologic fracture. Multiple other smaller lucent lesions in the visualized axial and appendicular skeleton, concerning for multifocal skeletal metastasis.  CT  ABDOMEN AND PELVIS FINDINGS  Hepatobiliary: The liver is enlarged (22.7 cm craniocaudal span) and very heterogeneous in appearance with what appear to be multiple hepatic nodules and masses (poorly evaluated on today's noncontrast CT examination), highly concerning for widespread hepatic metastases. Gallbladder is unremarkable in appearance.  Pancreas: No definite pancreatic mass or peripancreatic inflammatory changes on today's noncontrast CT examination.  Spleen: Unremarkable.  Adrenals/Urinary Tract: Multiple renal lesions bilaterally are incompletely characterized on today's noncontrast CT examination. These appear similar to the prior study, and the majority of these are low-attenuation, including the largest exophytic lesion in the posterior aspect of the interpolar region of the right kidney which measures 3.7 cm, likely to represent cysts; although several of these lesions are high attenuation, most likely to represent proteinaceous or hemorrhagic cysts. No hydroureteronephrosis in the visualized portions of the abdomen. The unenhanced appearance of the urinary bladder is normal.  Stomach/Bowel: The unenhanced appearance of the stomach is unremarkable. No pathologic dilatation of small bowel or colon. Normal appendix.  Vascular/Lymphatic: Extensive atherosclerosis throughout the abdominal and pelvic vasculature, without evidence of aneurysm. Extensive lymphadenopathy is noted throughout the visualized abdomen pelvis, including portacaval lymph node measuring up to 3.1 cm in short axis, numerous retroperitoneal lymph nodes measuring up to 1.4 cm in short axis, and pelvic lymph nodes measuring up to 1.2 cm in short axis in the right common iliac nodal position.  Reproductive: Prostate gland is enlarged with median lobe hypertrophy. Seminal vesicles are unremarkable. Penile prosthesis with reservoir in the lower right anterior abdominal wall.  Other: No significant volume of ascites.  No pneumoperitoneum.   Musculoskeletal: Numerous ill-defined lucent lesions in the visualized axial skeleton, concerning for multifocal metastases, most notably in the left side of the L3 vertebral body where there is a 12 mm lucent lesion (image 75 of series 2). 2.2 x 4.2 cm high attenuation (57 HU) lesion in the subcutaneous fat overlying the right buttocks (image 91 of series 2) may represent a hematoma, or potential soft tissue metastasis.  IMPRESSION: 1. Persistent and worsening atelectasis/consolidation in the left lower lobe, presumably secondary to either extrinsic or endobronchial obstruction of the left lower lobe bronchus, which likely is secondary to primary bronchogenic neoplasm. This is associated with worsening left hilar and bilateral mediastinal lymphadenopathy, worsening multifocal hepatic lesions which likely represent multifocal hepatic metastases, widespread skeletal metastases, and lymphadenopathy in the abdomen pelvis which is presumably metastatic. 2. Multiple renal lesions incompletely characterized on today's noncontrast CT examination. Statistically, these are likely to represent a combination of cysts and  proteinaceous/hemorrhagic cysts. 3. Atherosclerosis, including left main and 3 vessel coronary artery disease. 4. Cardiomegaly with biatrial dilatation. 5. Additional incidental findings, as above. These results will be called to the ordering clinician or representative by the Radiologist Assistant, and communication documented in the PACS or zVision Dashboard.   Electronically Signed   By: Vinnie Langton M.D.   On: 08/31/2014 13:49   Dg Chest 2 View  09/14/2014   CLINICAL DATA:  Follow-up CHF and pneumonia.  Dyspnea.  EXAM: CHEST  2 VIEW  COMPARISON:  09/10/2014 chest radiograph  FINDINGS: Stable configuration of single lead left subclavian pacemaker with lead tip overlying the right ventricle stable cardiomediastinal silhouette with moderate cardiomegaly. No pneumothorax. No right pleural effusion. No  overt pulmonary edema. Persistent complete left lower lobe consolidation. Likely stable small left pleural effusion. Mild degenerative changes in the thoracic spine.  IMPRESSION: 1. Stable cardiomegaly, without overt pulmonary edema. 2. Persistent complete left lower lobe consolidation and likely small left pleural effusion. Underlying central left lower lobe neoplasm not excluded, see 08/31/2014 CT report for further details.   Electronically Signed   By: Ilona Sorrel M.D.   On: 09/14/2014 11:40   Dg Chest 2 View  09/08/2014   CLINICAL DATA:  Shortness of breath for about a month. Abdominal pain. Diagnosed with a "Lung mass about a week ago" .  EXAM: CHEST  2 VIEW  COMPARISON:  Plain films 08/22/2014.  CT of 08/31/2014  FINDINGS: Mild right hemidiaphragm elevation. Single lead pacer tip at right ventricle. Patient rotated minimally left. Midline trachea. Mild cardiomegaly. Atherosclerosis in the transverse aorta. No pleural effusion or pneumothorax. Dense left lower lobe opacity again identified, similar. Cutoff of the left lower lobe bronchus.  IMPRESSION: No change since 08/22/2014.  Left lower lobe opacity, better evaluated on 08/31/2014 CT.  Cardiomegaly and atherosclerosis.   Electronically Signed   By: Abigail Miyamoto M.D.   On: 09/08/2014 17:20   Ct Head Wo Contrast  09/08/2014   CLINICAL DATA:  Shortness of breath for 1 month. Double vision. A biopsy has shown that the patient has cancer but the patient has not been told what type.  EXAM: CT HEAD WITHOUT CONTRAST  TECHNIQUE: Contiguous axial images were obtained from the base of the skull through the vertex without intravenous contrast.  COMPARISON:  08/22/2014  FINDINGS: Ventricles and sulci appear symmetrical. No mass effect or midline shift. No abnormal extra-axial fluid collections. Gray-white matter junctions are distinct. Basal cisterns are not effaced. No evidence of acute intracranial hemorrhage. No depressed skull fractures. Retention cysts in  the sphenoid sinus. Mastoid air cells are not opacified.  IMPRESSION: No acute intracranial abnormalities.   Electronically Signed   By: Lucienne Capers M.D.   On: 09/08/2014 20:29   Ct Chest Wo Contrast  08/31/2014   CLINICAL DATA:  81 year old male with abdominal pain for the past month. Weight loss for the past several weeks.  EXAM: CT CHEST, ABDOMEN AND PELVIS WITHOUT CONTRAST  TECHNIQUE: Multidetector CT imaging of the chest, abdomen and pelvis was performed following the standard protocol without IV contrast.  COMPARISON:  Chest CT 07/25/2014.  FINDINGS: CT CHEST FINDINGS  Mediastinum/Lymph Nodes: Heart size is enlarged with biatrial dilatation. There is no significant pericardial fluid, thickening or pericardial calcification. There is atherosclerosis of the thoracic aorta, the great vessels of the mediastinum and the coronary arteries, including calcified atherosclerotic plaque in the left main, left anterior descending, left circumflex and right coronary arteries. Mild calcifications of the aortic  valve. Left-sided pacemaker device in position with lead tip terminating in the right ventricular apex. Multiple borderline enlarged and enlarged mediastinal lymph nodes are noted measuring up to 19 mm in short axis in the subcarinal nodal station and 18 mm in short axis in the right paratracheal nodal station. Fullness of soft tissues in the left hilar region suggesting underlying lymphadenopathy, poorly evaluated on today's noncontrast chest CT. Esophagus is unremarkable in appearance. No axillary lymphadenopathy.  Lungs/Pleura: Previously demonstrated atelectasis in the left lower lobe has progressed, now with complete collapse/ consolidation of the left lower lobe. There is abrupt cut off of the left lower lobe bronchus on image 27 of series 4, likely related to either extrinsic obstruction or endobronchial lesion. No definite pleural effusions. Patchy multifocal peribronchovascular ground-glass  attenuation in the right upper and middle lobes is nonspecific, but favored to be infectious or inflammatory in etiology, with the largest ground-glass attenuation area measuring 11 x 8 mm in the lateral segment of the right middle lobe (image 22 of series 4). These are more apparent than the prior study.  Musculoskeletal/Soft Tissues: 1.7 cm lytic lesion in the left side of the manubrium (image 13 of series 4). Expansile lesion with moth-eaten appearance in the lateral aspect of the left seventh rib, with possible nondisplaced pathologic fracture. Multiple other smaller lucent lesions in the visualized axial and appendicular skeleton, concerning for multifocal skeletal metastasis.  CT ABDOMEN AND PELVIS FINDINGS  Hepatobiliary: The liver is enlarged (22.7 cm craniocaudal span) and very heterogeneous in appearance with what appear to be multiple hepatic nodules and masses (poorly evaluated on today's noncontrast CT examination), highly concerning for widespread hepatic metastases. Gallbladder is unremarkable in appearance.  Pancreas: No definite pancreatic mass or peripancreatic inflammatory changes on today's noncontrast CT examination.  Spleen: Unremarkable.  Adrenals/Urinary Tract: Multiple renal lesions bilaterally are incompletely characterized on today's noncontrast CT examination. These appear similar to the prior study, and the majority of these are low-attenuation, including the largest exophytic lesion in the posterior aspect of the interpolar region of the right kidney which measures 3.7 cm, likely to represent cysts; although several of these lesions are high attenuation, most likely to represent proteinaceous or hemorrhagic cysts. No hydroureteronephrosis in the visualized portions of the abdomen. The unenhanced appearance of the urinary bladder is normal.  Stomach/Bowel: The unenhanced appearance of the stomach is unremarkable. No pathologic dilatation of small bowel or colon. Normal appendix.   Vascular/Lymphatic: Extensive atherosclerosis throughout the abdominal and pelvic vasculature, without evidence of aneurysm. Extensive lymphadenopathy is noted throughout the visualized abdomen pelvis, including portacaval lymph node measuring up to 3.1 cm in short axis, numerous retroperitoneal lymph nodes measuring up to 1.4 cm in short axis, and pelvic lymph nodes measuring up to 1.2 cm in short axis in the right common iliac nodal position.  Reproductive: Prostate gland is enlarged with median lobe hypertrophy. Seminal vesicles are unremarkable. Penile prosthesis with reservoir in the lower right anterior abdominal wall.  Other: No significant volume of ascites.  No pneumoperitoneum.  Musculoskeletal: Numerous ill-defined lucent lesions in the visualized axial skeleton, concerning for multifocal metastases, most notably in the left side of the L3 vertebral body where there is a 12 mm lucent lesion (image 75 of series 2). 2.2 x 4.2 cm high attenuation (57 HU) lesion in the subcutaneous fat overlying the right buttocks (image 91 of series 2) may represent a hematoma, or potential soft tissue metastasis.  IMPRESSION: 1. Persistent and worsening atelectasis/consolidation in the left lower lobe,  presumably secondary to either extrinsic or endobronchial obstruction of the left lower lobe bronchus, which likely is secondary to primary bronchogenic neoplasm. This is associated with worsening left hilar and bilateral mediastinal lymphadenopathy, worsening multifocal hepatic lesions which likely represent multifocal hepatic metastases, widespread skeletal metastases, and lymphadenopathy in the abdomen pelvis which is presumably metastatic. 2. Multiple renal lesions incompletely characterized on today's noncontrast CT examination. Statistically, these are likely to represent a combination of cysts and proteinaceous/hemorrhagic cysts. 3. Atherosclerosis, including left main and 3 vessel coronary artery disease. 4.  Cardiomegaly with biatrial dilatation. 5. Additional incidental findings, as above. These results will be called to the ordering clinician or representative by the Radiologist Assistant, and communication documented in the PACS or zVision Dashboard.   Electronically Signed   By: Vinnie Langton M.D.   On: 08/31/2014 13:49   US Renal  09/10/2014   CLINICAL DATA:  Acute renal failure. History of metastatic lung cancer.  EXAM: RENAL / URINARY TRACT ULTRASOUND COMPLETE  COMPARISON:  None.  FINDINGS: Right Kidney:  Length: 12.1 cm. Diffusely increased parenchymal echotexture consistent with chronic medical renal disease. No hydronephrosis. Multiple simple appearing cysts. Largest measuring about 3.8 cm diameter.  Left Kidney:  Length: 11.9 cm. Diffusely increased parenchymal echotexture consistent with chronic medical renal disease. No hydronephrosis. Multiple simple appearing cysts. Largest measures about 2.6 cm maximal diameter.  Bladder:  Appears normal for degree of bladder distention.  IMPRESSION: Diffusely increased parenchymal echotexture bilaterally consistent with medical renal disease. No hydronephrosis. Multiple bilateral renal cysts.   Electronically Signed   By: Lucienne Capers M.D.   On: 09/10/2014 02:48   Nm Pulmonary Perf And Vent  09/09/2014   CLINICAL DATA:  Shortness of breath. Leg pain. Lung mass. Evaluate for pulmonary embolism.  EXAM: NUCLEAR MEDICINE VENTILATION - PERFUSION LUNG SCAN  TECHNIQUE: Ventilation images were obtained in multiple projections using inhaled aerosol Tc-40mDTPA. Perfusion images were obtained in multiple projections after intravenous injection of Tc-978mAA.  RADIOPHARMACEUTICALS:  40 Technetium-9924mPA aerosol inhalation and 6 Technetium-33m34m IV  COMPARISON:  Chest CT - 08/31/2014 ; chest radiograph - 09/08/2014; 08/22/2014  FINDINGS: Chest radiograph performed 09/08/2014 demonstrates grossly unchanged enlarged cardiac silhouette and mediastinal contours with  atherosclerotic plaque within the thoracic aorta. Stable positioning of left anterior chest wall single lead pacemaker with lead tip overlying expected location of the right ventricular apex. Dense left basilar consolidative opacities compatible with near collapse of the left lower lobe secondary to suspected endobronchial or pulmonary malignancy.  Ventilation: There is clumping of inhaled radiotracer about the pulmonary hila with expected preferential ventilation of the right lung in comparison to the left. There is decreased ventilation of the left lower lobe compatible with the known left lower lobe pulmonary mass. Photopenic defect is seen at the location of the patient's left anterior chest wall pacemaker power pack. Ingested radiotracer is seen within the hypopharynx.  Perfusion: There is relative homogeneous distribution of injected radiotracer throughout the pulmonary parenchyma with expected matched area of non perfusion involving the left lower lobe at the location of the patient's known left lower lobe pulmonary mass. No discrete mismatched areas of perfusion within the pulmonary parenchyma. Expected focal attenuation is seen at the patient's left anterior chest wall pacemaker power pack site.  IMPRESSION: Triple matched defect involving the left lower lobe at the location of the patient's known left lower lobe endobronchial/perihilar mass. Given the presence of a triple matched defect, technically this CT scan could be characterized as intermediate  probability, though would favor this is an expected VQ scan given findings on recently obtained chest CT obtained 08/31/2014 and give lack of any additional mismatched perfusion defects would favor this represents a low probability VQ scan. Clinical correlation is advised. Further evaluation could be performed with lower extremity venous Doppler ultrasound as clinically indicated.  Critical Value/emergent results were called by telephone at the time of  interpretation on 09/09/2014 at 11:06 am to Bradley. Titus Mould, who verbally acknowledged these results.   Electronically Signed   By: Sandi Mariscal M.D.   On: 09/09/2014 11:13   US Biopsy  09/05/2014   CLINICAL DATA:  Innumerable hepatic metastases, suspect lung primary with an obscured left lower lobe hilar mass resulting in complete left lower lobe collapse.  EXAM: ULTRASOUND GUIDED CORE BIOPSY OF LEFT HEPATIC MASS  MEDICATIONS: 0.5 mg IV Versed; 25 mcg IV Fentanyl  Total Moderate Sedation Time: 10 MINUTES  PROCEDURE: The procedure, risks, benefits, and alternatives were explained to the patient. Questions regarding the procedure were encouraged and answered. The patient understands and consents to the procedure.  The ANTERIOR EPIGASTRIC REGION was prepped with ChloraPrep in a sterile fashion, and a sterile drape was applied covering the operative field. A sterile gown and sterile gloves were used for the procedure. Local anesthesia was provided with 1% Lidocaine.  Previous imaging reviewed. Preliminary ultrasound performed. The liver is enlarged with numerous hypoechoic solid lesions throughout. A left hepatic mass in the lateral segment was targeted for biopsy. Under sterile conditions and local anesthesia, a 17 gauge 6.8 cm access needle was advanced percutaneously under direct ultrasound. Needle tract initially traversed normal liver tissue into a hypoechoic lesion. Needle position confirmed with ultrasound. 3 18 gauge core biopsies obtained. Samples placed in formalin. Needle removed. No immediate complication. Patient tolerated the procedure well.  COMPLICATIONS: None.  FINDINGS: Imaging confirms needle placement in the left hepatic lesion for core biopsy  IMPRESSION: Successful ultrasound left hepatic mass 18 gauge core biopsy   Electronically Signed   By: Jerilynn Mages.  Shick M.D.   On: 09/05/2014 12:14   Dg Chest Port 1 View  09/10/2014   CLINICAL DATA:  Lung mass. Hypertension. Asthma. Diabetes. Ex-smoker for 40  years.  EXAM: PORTABLE CHEST - 1 VIEW  COMPARISON:  09/08/2014  FINDINGS: Single lead pacer. Right hemidiaphragm elevation. Midline trachea. Moderate cardiomegaly with transverse aortic atherosclerosis. Suspect a small layering left pleural effusion. No pneumothorax. Low lung volumes with resultant pulmonary interstitial prominence. Mild concurrent venous congestion is suspected. Dense opacity in the retrocardiac left lower lobe, corresponding to masslike consolidation on the prior CT.  IMPRESSION: Redemonstration of left lower lobe opacity, better delineated on 08/31/2014 CT.  Diminished lung volumes.  Suspect mild pulmonary venous congestion.  Small layering left pleural effusion.   Electronically Signed   By: Abigail Miyamoto M.D.   On: 09/10/2014 08:40    Microbiology: Recent Results (from the past 240 hour(s))  Culture, blood (routine x 2)     Status: None   Collection Time: 09/08/14  4:27 PM  Result Value Ref Range Status   Specimen Description BLOOD RIGHT ANTECUBITAL  Final   Special Requests BOTTLES DRAWN AEROBIC AND ANAEROBIC 5 CC EA  Final   Culture   Final    NO GROWTH 5 DAYS Performed at [facility]    Report Status 09/13/2014 FINAL  Final  Culture, blood (routine x 2)     Status: None   Collection Time: 09/08/14  4:27 PM  Result Value  Ref Range Status   Specimen Description BLOOD RIGHT HAND  Final   Special Requests BOTTLES DRAWN AEROBIC AND ANAEROBIC 5 CC EA  Final   Culture   Final    NO GROWTH 5 DAYS Performed at [facility]    Report Status 09/13/2014 FINAL  Final  Urine culture     Status: None   Collection Time: 09/08/14  5:39 PM  Result Value Ref Range Status   Specimen Description URINE, CLEAN CATCH  Final   Special Requests Normal  Final   Culture   Final    8,000 COLONIES/mL INSIGNIFICANT GROWTH Performed at [facility]    Report Status 09/10/2014 FINAL  Final  MRSA PCR Screening     Status: None   Collection Time: 09/08/14  9:02 PM   Result Value Ref Range Status   MRSA by PCR NEGATIVE NEGATIVE Final    Comment:        The GeneXpert MRSA Assay (FDA approved for NASAL specimens only), is one component of a comprehensive MRSA colonization surveillance program. It is not intended to diagnose MRSA infection nor to guide or monitor treatment for MRSA infections.   Culture, sputum-assessment     Status: None   Collection Time: 09/16/14 10:51 PM  Result Value Ref Range Status   Specimen Description SPUTUM  Final   Special Requests NONE  Final   Sputum evaluation   Final    THIS SPECIMEN IS ACCEPTABLE. RESPIRATORY CULTURE REPORT TO FOLLOW.   Report Status 09/16/2014 FINAL  Final  Culture, respiratory (NON-Expectorated)     Status: None (Preliminary result)   Collection Time: 09/16/14 10:51 PM  Result Value Ref Range Status   Specimen Description SPUTUM  Final   Special Requests NONE  Final   Gram Stain PENDING  Incomplete   Culture   Final    Culture reincubated for better growth Performed at [facility]    Report Status PENDING  Incomplete     Labs: Basic Metabolic Panel:  Recent Labs Lab 09/13/14 0517 09/14/14 0535 09/15/14 0507 09/16/14 0413 09/17/14 0526 09/18/14 0538  NA 136 133* 134* 135 133*  --   K 4.9 4.2 4.1 4.4 4.6  --   CL 97* 92* 90* 91* 90*  --   CO2 12* 14* 16* 17* 19*  --   GLUCOSE 77 76 83 93 86  --   BUN 118* 115* 100* 96* 94*  --   CREATININE 2.41* 2.43* 2.13* 1.97* 2.04* 1.93*  CALCIUM 9.4 9.4 9.6 10.0 10.0  --    Liver Function Tests: No results for input(s): AST, ALT, ALKPHOS, BILITOT, PROT, ALBUMIN in the last 168 hours. No results for input(s): LIPASE, AMYLASE in the last 168 hours.  Recent Labs Lab 09/14/14 1630 09/16/14 0413 09/17/14 0800 09/18/14 0538  AMMONIA 83* 70* 62* 79*   CBC:  Recent Labs Lab 09/14/14 0535 09/15/14 0507 09/16/14 0413 09/17/14 0500 09/18/14 0538  WBC 17.4* 18.1* 17.5* 17.9* 17.3*  HGB 10.4* 10.3* 10.0* 10.3* 9.8*  HCT  31.0* 31.0* 30.8* 31.6* 31.7*  MCV 71.8* 72.4* 73.0* 74.0* 76.0*  PLT 214 201 170 152 154   Cardiac Enzymes: No results for input(s): CKTOTAL, CKMB, CKMBINDEX, TROPONINI in the last 168 hours. BNP: BNP (last 3 results) No results for input(s): BNP in the last 8760 hours.  ProBNP (last 3 results) No results for input(s): PROBNP in the last 8760 hours.  CBG:  Recent Labs Lab 09/17/14 0749 09/17/14 1146 09/17/14 1612  09/17/14 2051 09/18/14 0748  GLUCAP 83 102* 96 107* 86       Signed:  Salem Lembke A  [facility] 09/18/2014, 9:57 AM

## 2014-09-18 NOTE — Progress Notes (Signed)
ANTICOAGULATION CONSULT NOTE -Follow Up Consult  Pharmacy Consult for Heparin--> Enoxaparin, Warfarin Indication: atrial fibrillation  Allergies  Allergen Reactions  . Amoxicillin Itching and Rash    Patient Measurements: Height: '5\' 9"'$  (175.3 cm) Weight: 210 lb 5.1 oz (95.4 kg) IBW/kg (Calculated) : 70.7 Heparin Dosing Weight: 89.3 kg  Vital Signs: Temp: 98.4 F (36.9 C) (09/05 0300) Temp Source: Oral (09/05 0300) BP: 109/61 mmHg (09/05 0942) Pulse Rate: 84 (09/05 0942)  Labs:  Recent Labs  09/16/14 0413 09/17/14 0500 09/17/14 0526 09/18/14 0538  HGB 10.0* 10.3*  --  9.8*  HCT 30.8* 31.6*  --  31.7*  PLT 170 152  --  154  LABPROT 15.6*  --  15.3* 17.0*  INR 1.22  --  1.19 1.37  HEPARINUNFRC 0.49  --  0.40  --   CREATININE 1.97*  --  2.04* 1.93*    Estimated Creatinine Clearance: 34.8 mL/min (by C-G formula based on Cr of 1.93).   Medical History: Past Medical History  Diagnosis Date  . High blood pressure   . Irregular heart rhythm   . Asthma   . Diabetes mellitus   . High cholesterol   . Kidney disease   . Sleep apnea   . Sickle cell trait   . A-fib   . Complete heart block   . Pacemaker     Medications:  Scheduled:  . carvedilol  12.5 mg Oral BID WC  . enoxaparin (LOVENOX) injection  1 mg/kg Subcutaneous Q12H  . feeding supplement (ENSURE ENLIVE)  237 mL Oral BID BM  . furosemide  40 mg Oral BID  . guaiFENesin  600 mg Oral BID  . insulin aspart  0-5 Units Subcutaneous QHS  . insulin aspart  0-9 Units Subcutaneous TID WC  . ipratropium-albuterol  3 mL Nebulization BID  . lactulose  30 g Oral TID  . levofloxacin  750 mg Oral Q48H  . polyethylene glycol  17 g Oral Daily  . sodium bicarbonate  1,300 mg Oral TID  . Warfarin - Pharmacist Dosing Inpatient   Does not apply q1800   Infusions:     Assessment: 81 year old male recently diagnosed with left lower lobe lung mass with metastasis to the liver, s/p biopsy on 09/05/14.  He was admitted  09/08/14 with worsening shortness of breath and weakness.    Patient was taking Apixaban (Eliquis) PTA for chronic Afib.  Given ARF and compromised hepatic function, CCM consulted pharmacy to transition to IV heparin. Warfarin started on 8/31.  Patient was on Warfarin '1mg'$  ~ 75yrago prior to switching to Apixaban. Today, pharmacy asked to transition patient from heparin to enoxaparin for bridging to warfarin.  09/18/2014:  INR subtherapeutic, but up 1.37  Cbc stable  SCr down 1.93 (crcl~35)  No bleeding documented  Diet: heart healthy diet + Ensure Enlive supplements  Drug Interactions with Warfarin: Levofloxacin can increase INR  Weight = 95 kg (standing)  Plan for possible discharge today   Goal of Therapy:  Anti-Xa level 0.6-1 units/ml 4hrs after LMWH dose given  Monitor platelets by anticoagulation protocol: Yes  INR 2-3   Plan:   Repeat Warfarin 7.5 mg PO x 1 today if still here tonight.  For discharge: if INR can be checked in the next 1-2 days, then recommend Warfarin 7.'5mg'$  daily.  If INR can't be checked until >3 days out, then warfarin '5mg'$  daily  Change lovenox to 100 mg q12h and recommend this for discharge  Daily PT/INR  CBC at least q72h.  Monitor closely for s/s of bleeding  Dia Sitter, PharmD, BCPS 09/18/2014 10:24 AM

## 2014-09-18 NOTE — Progress Notes (Signed)
Physical Therapy Treatment Patient Details Name: Bradley Rasmussen MRN: [id number] DOB: 1931/12/16 Today's Date: 09/18/2014    History of Present Illness 81 yo male admitted with SOB, A fib, weakness. Hx of HTN, DM, sleep apnea, pacemaker, sickle cell trait, lung cancer with mets to liver.     PT Comments    Pt ambulated 30' x 2 with RW and seated rest break. Distance limited by 3/4 dyspnea. Pt states he is at baseline for ambulation.   Follow Up Recommendations  Home health PT;Supervision/Assistance - 24 hour     Equipment Recommendations  Hospital bed;Wheelchair (measurements PT);Wheelchair cushion (measurements PT)    Recommendations for Other Services OT consult     Precautions / Restrictions Precautions Precautions: Fall Restrictions Weight Bearing Restrictions: No    Mobility  Bed Mobility               General bed mobility comments: NT- up in recliner  Transfers Overall transfer level: Needs assistance Equipment used: Rolling walker (2 wheeled) Transfers: Sit to/from Stand Sit to Stand: Min guard         General transfer comment: min/guard for safety  Ambulation/Gait Ambulation/Gait assistance: Supervision Ambulation Distance (Feet): 60 Feet (30' x 2 with seated rest break) Assistive device: Rolling walker (2 wheeled) Gait Pattern/deviations: Step-to pattern;Decreased step length - left;Decreased step length - right   Gait velocity interpretation: Below normal speed for age/gender General Gait Details: distance limited by fatigue/dyspnea   Stairs            Wheelchair Mobility    Modified Rankin (Stroke Patients Only)       Balance             Standing balance-Leahy Scale: Poor                      Cognition Arousal/Alertness: Awake/alert Behavior During Therapy: WFL for tasks assessed/performed Overall Cognitive Status: Within Functional Limits for tasks assessed                      Exercises      General  Comments        Pertinent Vitals/Pain Pain Assessment: No/denies pain    Home Living                      Prior Function            PT Goals (current goals can now be found in the care plan section) Acute Rehab PT Goals Patient Stated Goal: to get stronger Time For Goal Achievement: 09/28/14 Potential to Achieve Goals: Fair Progress towards PT goals: Progressing toward goals    Frequency  Min 3X/week    PT Plan Current plan remains appropriate    Co-evaluation             End of Session Equipment Utilized During Treatment: Gait belt Activity Tolerance: Patient limited by fatigue Patient left: in chair;with call bell/phone within reach;with family/visitor present;with nursing/sitter in room     Time: 1207-1225 PT Time Calculation (min) (ACUTE ONLY): 18 min  Charges:  $Gait Training: 8-22 mins                    G Codes:      Philomena Doheny 09/18/2014, 12:39 PM [phone]

## 2014-09-18 NOTE — Progress Notes (Signed)
CSW consulted for transportation needs. Patient will need non-emergency ambulance transport home. CSW confirmed home address with patient's wife at bedside. [facility] called for transport. No other CSW needs identified - CSW signing off.   Raynaldo Opitz, [facility] Clinical Social Worker cell #: [phone]

## 2014-09-18 NOTE — Progress Notes (Signed)
Oxygen sats on 1l/m [place] have been 98 - 100% for at least the past 24 hours.  Have placed pt back on RA and sats have been at least 94%.  Will continue to monitor

## 2014-09-19 ENCOUNTER — Ambulatory Visit: Payer: Self-pay | Admitting: Pulmonary Disease

## 2014-09-19 ENCOUNTER — Ambulatory Visit
Admission: RE | Admit: 2014-09-19 | Discharge: 2014-09-19 | Disposition: A | Discharge status: Home / Self Care | Payer: Commercial Managed Care - HMO | Source: Ambulatory Visit | Attending: Radiation Oncology | Admitting: Radiation Oncology

## 2014-09-19 VITALS — HR 75

## 2014-09-19 DIAGNOSIS — C3432 Malignant neoplasm of lower lobe, left bronchus or lung: Secondary | ICD-10-CM | POA: Diagnosis not present

## 2014-09-19 DIAGNOSIS — Z510 Encounter for antineoplastic radiation therapy: Secondary | ICD-10-CM | POA: Diagnosis not present

## 2014-09-19 LAB — CULTURE, RESPIRATORY W GRAM STAIN
Culture: NORMAL
Gram Stain: NONE SEEN

## 2014-09-19 LAB — CULTURE, RESPIRATORY

## 2014-09-19 NOTE — Progress Notes (Signed)
Bradley Rasmussen has received 4 fractions to his left chest.  He is very fatigued today and was unable to stand to weigh.  He is traveling by W/C.  He denies any pain at this time.   Edema note in bilateral upper and lower extremities.

## 2014-09-19 NOTE — Progress Notes (Signed)
  Radiation Oncology         [phone] ________________________________  Name: Bradley Rasmussen MRN: [id number]  Date: 09/19/2014  DOB: Nov 05, 1931  Weekly Radiation Therapy Management    ICD-9-CM ICD-10-CM   1. Malignant neoplasm of lower lobe of left lung 162.5 C34.32      Current Dose: 12 Gy     Planned Dose:  30 Gy  Narrative . . . . . . . . The patient presents for routine under treatment assessment.                                   The patient was discharged from the hospital on September 5. He denies any swallowing problems. His breathing is unchanged. Continues to have a lot of edema in his upper and lower extremities which he had while in the hospital. Patient would like to continue using a condom catheter no supplies were given for this. According to family members a hospital bed has been delivered. Family members report some problems with nosebleeds. I have recommended that  he present to the emergency room if his bleeding cannot be controlled at home.                                 Set-up films were reviewed.                                 The chart was checked. Physical Findings. . .  pulse is 75. His oxygen saturation is 100%. . The oral cavity is somewhat dry. Patient continues to have decreased breath sounds in the left lower lung field. The heart has a regular rhythm and rate. Pitting edema noted throughout both lower extremities some edema also noted in the upper extremities. Impression . . . . . . . The patient is tolerating radiation. Plan . . . . . . . . . . . . Continue treatment as planned.  ________________________________   Blair Promise, PhD, MD

## 2014-09-20 ENCOUNTER — Encounter: Payer: Self-pay | Admitting: *Deleted

## 2014-09-20 ENCOUNTER — Ambulatory Visit
Admit: 2014-09-20 | Discharge: 2014-09-20 | Disposition: A | Discharge status: Home / Self Care | Payer: Commercial Managed Care - HMO | Attending: Radiation Oncology | Admitting: Radiation Oncology

## 2014-09-20 ENCOUNTER — Other Ambulatory Visit (HOSPITAL_COMMUNITY)
Admission: RE | Admit: 2014-09-20 | Discharge: 2014-09-20 | Disposition: A | Discharge status: Home / Self Care | Payer: Commercial Managed Care - HMO | Source: Ambulatory Visit | Attending: Hematology | Admitting: Hematology

## 2014-09-20 ENCOUNTER — Other Ambulatory Visit: Payer: Self-pay

## 2014-09-20 ENCOUNTER — Encounter: Payer: Self-pay | Admitting: Radiation Oncology

## 2014-09-20 DIAGNOSIS — C3490 Malignant neoplasm of unspecified part of unspecified bronchus or lung: Secondary | ICD-10-CM

## 2014-09-20 DIAGNOSIS — Z510 Encounter for antineoplastic radiation therapy: Secondary | ICD-10-CM | POA: Diagnosis not present

## 2014-09-20 DIAGNOSIS — C787 Secondary malignant neoplasm of liver and intrahepatic bile duct: Secondary | ICD-10-CM | POA: Insufficient documentation

## 2014-09-20 DIAGNOSIS — C3432 Malignant neoplasm of lower lobe, left bronchus or lung: Secondary | ICD-10-CM | POA: Insufficient documentation

## 2014-09-20 NOTE — Progress Notes (Signed)
[place] Work  Clinical Social Work was referred by Georgiana Shore, [place] SW due to concerns at home. Pt very weak and having issues with transportation. [employer] medicare can provide transportation, but only within 25 miles of his home. Family struggling to get to [facility] currently. Family also having care issues at home, pt appears to need more help than family can provide safely. HH currently following.  CSW will reach out to family and further assist. Pt may need geographically closer treatment due to transportation issues.    Clinical Social Work interventions: Pt advocacy Loren Racer, [place] Worker [place]  [place] Phone: [phone] Fax: [phone]

## 2014-09-20 NOTE — Patient Outreach (Addendum)
Transition of care call:  10:53am  Place call to patient. Spoke with patient who wanted me to talk with his wife and daughter in law Juliette Mangle).  SUBJECTIVE:  Patient reports " same amount of shortness of breath when he went to the hospital"  Wife reports that patient slept a lot yesterday after first radiation treatment.  ([place]). Wife reports when patient is wheezing that she assist him with a breathing treatment. Wife reports home health nurse named Angelena Sole saw patient yesterday.  Wife reports that patient therapist is coming out today to see patient and patient is also going to the cancer center at 3pm for radiation treatment. Wife reports patient had a nosebleed on 09/18/2014 and she has not given him any coumadin since.  Reports that he did not take his lactulose yesterday because of his appointments.   Wife reports difficulty managing patient to appointments due to lack of wheelchair ramp. Reports that patient now has a hospital bed which is helpful at home.    OBJECTIVE: Reviewed all medications with patient and wife.  No weights today.  ASSESSMENT: (1) Not taking coumadin. (2) No wheelchair ramp. (3) no follow up with primary MD. (4) active with [place].  PLAN: (1) Notified MD office of concern. (2) will send order to [place] worker to assist with securing a wheelchair ramp. (3) Appointment made for patient to have follow up on 9/8 (4) Patient will continue to inform nurse with [place] concerns or call MD office.  [facility] CM Care Plan Problem One        Most Recent Value   Care Plan Problem One  Recent admission related to lung and liver cancer   Role Documenting the Problem One  Care Management [place] for Problem One  Active   THN Long Term Goal (31-90 days)  Patient will be able to report no readmissions in the next 31 days.   THN Long Term Goal Start Date  09/20/14   Interventions for Problem One Long  Term Goal  Discussed with patient and wife the importance of timely follow with primary MD. Reviewed discharge medications.   THN CM Short Term Goal #1 (0-30 days)  Patient will verbalize hospital follow up with primary MD in the next 7 days.   THN CM Short Term Goal #1 Start Date  09/20/14   Interventions for Short Term Goal #1  Discussed importance of follow up with primary MD. Placed call to MD office and provided update and attained appointment for patient.   THN CM Short Term Goal #2 (0-30 days)  Patient will report an understanding of how to take medications in the next 7 days.   THN CM Short Term Goal #2 Start Date  09/20/14   Interventions for Short Term Goal #2  Reviewed all discharge medications wiith wife and daughter in law. Discussed importance of each medication and reason for taking each medications. Wife happy that someone took time to explain importance of each medication in relattion to patient medical condition. Encouraged paitent and wife to discuss medications with primary MD.    [facility] CM Care Plan Problem Two        Most Recent Value   Role Documenting the Problem Two  Clinical Pharmacist      Care Coordination on 09/20/2014 11:09am  Placed call to [facility] ( spoke with Marie)to discuss concerns about patient and not taking coumadin and recent nose bleed. 11:26 am  Placed call to Marthenia Rolling and requested hospital discharge be sent to primary MD. 11:52 am Placed call to wife and informed of follow up appointment with Dr. Nyra Capes at 1:30 pm on 9/8. Wife states that home health nurse is coming to draw patients blood. Wife ask I call daughter in law to inform of appointment. 11:57 am Placed call to daughter in law and left a voice mail requesting a call back. 2:35pm  Call back from Lelan Pons at [facility] and she reports MD wanted patient to go to ED if he is still having nosebleeds.  Informed Lelan Pons that wife reported home health coming to draw blood  today. 4:20pm Placed call to [place] who reports that nurse when out to draw blood today and INR was 1.5.  Reports that order was from Dr. Geraldo Pitter.  Reports nurse has not yet called result to MD. 4:28pm  Placed call to Lelan Pons at [facility] and informed of update from [place].  Will send this report to MD. Patient will continue to be followed for weekly transition of care calls. Referral to [place] worker to assist with getting a ramp for patient.  Tomasa Rand, RN, BSN, CEN [facility] [employer] [phone]

## 2014-09-21 ENCOUNTER — Other Ambulatory Visit: Payer: Self-pay

## 2014-09-21 ENCOUNTER — Telehealth: Payer: Self-pay | Admitting: Oncology

## 2014-09-21 ENCOUNTER — Ambulatory Visit: Payer: Commercial Managed Care - HMO

## 2014-09-21 NOTE — Telephone Encounter (Signed)
Called Bradley Rasmussen because he did not come in for his radiation appointment today.  Per his daughter in law, Bradley Rasmussen, he had another appointment and was too tired to come in today.  He will be here tomorrow.

## 2014-09-21 NOTE — Patient Outreach (Signed)
Care Coordination: Placed call to patient. Spoke with wife and son Axcel Horsch.  Reminded wife of pending MD appointment with Dr. Nyra Capes on 9/8.   Wife reports that she got a call from cardiologist but they were not home. I suggested that wife call [place] nurse and ask about what to do about the coumadin for tonight. Provided contact phone number to [place].  Also informed wife and son that I made a referral to [place] worker for assistance with a wheelchair ramp.  Both wife and son voiced understanding.  Tomasa Rand, RN, BSN, CEN [facility] [employer] [phone]

## 2014-09-21 NOTE — Patient Outreach (Signed)
[place] [facility]) Care Management  09/21/2014  Bradley Rasmussen 03-16-1931 [id number]   Request from Tomasa Rand, RN to assign SW, assigned [employer], [place].  Thanks, [name]. Alexis, Alicia Assistant Phone: [phone] Fax: [phone]

## 2014-09-22 ENCOUNTER — Ambulatory Visit: Payer: Commercial Managed Care - HMO

## 2014-09-22 ENCOUNTER — Telehealth: Payer: Self-pay | Admitting: Oncology

## 2014-09-22 ENCOUNTER — Other Ambulatory Visit: Payer: Self-pay

## 2014-09-22 NOTE — Patient Outreach (Signed)
Care Coordination: Placed call to MD office. Spoke with Lelan Pons who states that patient has accepted hospice. Referral was made to [facility] by MD office.    PLAN: Will call patient on Monday September 12th. To ensure hospice care is setting up. If hospice is all set I will plan to close case.  Discussed plan with [facility] who is in agreement.  Tomasa Rand, RN, BSN, CEN [facility] [employer] [phone]

## 2014-09-22 NOTE — Telephone Encounter (Addendum)
Called Jermar regarding his radiation treatment today.  Per his daughter in law, [name], he is not going to have anymore treatment and is having hospice come in.  She said his primary care doctor has ordered hospice.  Verbalized agreement and will notify Dr. Sondra Come.  Also notified Kristi on [place] [address].

## 2014-09-25 ENCOUNTER — Ambulatory Visit
Admission: RE | Admit: 2014-09-25 | Discharge: 2014-09-25 | Disposition: A | Discharge status: Home / Self Care | Payer: Commercial Managed Care - HMO | Source: Ambulatory Visit | Attending: Radiation Oncology | Admitting: Radiation Oncology

## 2014-09-25 ENCOUNTER — Encounter: Payer: Self-pay | Admitting: Hematology

## 2014-09-25 ENCOUNTER — Telehealth: Payer: Self-pay | Admitting: Hematology

## 2014-09-25 ENCOUNTER — Ambulatory Visit: Payer: Commercial Managed Care - HMO

## 2014-09-25 ENCOUNTER — Other Ambulatory Visit: Payer: Self-pay

## 2014-09-25 NOTE — Progress Notes (Signed)
I saw him in the hospital, see my inpt consult note. This encounter was created in error - please disregard.

## 2014-09-25 NOTE — Telephone Encounter (Signed)
Left message for patient so Bradley Rasmussen to return call in reference to new patient appt being canceled.m

## 2014-09-25 NOTE — Patient Outreach (Signed)
Case closure telephone call: Placed call to patient and spoke with wife. Wife confirms that patient is working with hospice care. Reports that they are managing.   Encouraged wife to call MD if needed or hospice nurse when she has concerns.  PLAN: Will close case as patient is now in hospice care.  Will notify MD.  Tomasa Rand, RN, BSN, CEN [place] Coordinator [phone]

## 2014-09-26 ENCOUNTER — Ambulatory Visit: Payer: Commercial Managed Care - HMO

## 2014-09-26 ENCOUNTER — Ambulatory Visit: Payer: Commercial Managed Care - HMO | Admitting: Radiation Oncology

## 2014-09-27 ENCOUNTER — Ambulatory Visit: Payer: Commercial Managed Care - HMO

## 2014-09-27 ENCOUNTER — Ambulatory Visit: Payer: Self-pay

## 2014-09-28 ENCOUNTER — Ambulatory Visit: Payer: Commercial Managed Care - HMO

## 2014-09-29 ENCOUNTER — Ambulatory Visit: Payer: Commercial Managed Care - HMO

## 2014-10-02 ENCOUNTER — Ambulatory Visit: Payer: Commercial Managed Care - HMO

## 2014-10-03 ENCOUNTER — Ambulatory Visit: Payer: Commercial Managed Care - HMO

## 2014-10-03 ENCOUNTER — Encounter (HOSPITAL_COMMUNITY): Payer: Self-pay

## 2014-10-04 ENCOUNTER — Ambulatory Visit: Payer: Commercial Managed Care - HMO

## 2014-10-05 ENCOUNTER — Ambulatory Visit: Payer: Commercial Managed Care - HMO

## 2014-10-05 ENCOUNTER — Ambulatory Visit: Payer: Commercial Managed Care - HMO | Admitting: Radiation Oncology

## 2014-10-14 NOTE — Patient Outreach (Signed)
[place] [facility]) Care Management  10/16/14  Larnie Heart [birth year] [id number]   Notification from Tomasa Rand, RN to close case due to patient is now enrolled in Hospice care.  Thanks, [name]. [place], [place] Assistant Phone: [phone] Fax: [phone]

## 2014-10-14 DEATH — deceased

## 2014-10-15 NOTE — Progress Notes (Signed)
°  Radiation Oncology         [phone] ________________________________  Name: Bradley Rasmussen MRN: [id number]  Date: 09/20/2014  DOB: June 17, 1931  End of Treatment Note    ICD-9-CM ICD-10-CM   1. Malignant neoplasm of lower lobe of left lung 162.5 C34.32     DIAGNOSIS: Poorly differentiated carcinoma, likely stage IV lung cancer     Indication for treatment:  Bronchial obstruction and lung collapse       Radiation treatment dates:   09/13/2014-09/20/2014  Site/dose:   Left central chest area, 15 gray in 5 fractions  Beams/energy:   3-D conformal  Narrative: The patient completed an abbreviated course of radiation therapy. The patient's performance status deteriorated significantly and the patient and family members wished that his treatments be stopped and to proceed with hospice.  Plan: When necessary follow-up.   -----------------------------------  Blair Promise, PhD, MD

## 2016-09-03 IMAGING — CR DG CHEST 2V
2 series · 2 of 2 positions shown · non-contrast
Comparison: Plain films 08/22/2014.  CT of 08/31/2014

CLINICAL DATA: Shortness of breath for about a month. Abdominal
pain. Diagnosed with a "Lung mass about a week ago" .

EXAM:
CHEST  2 VIEW

[w chest pa]
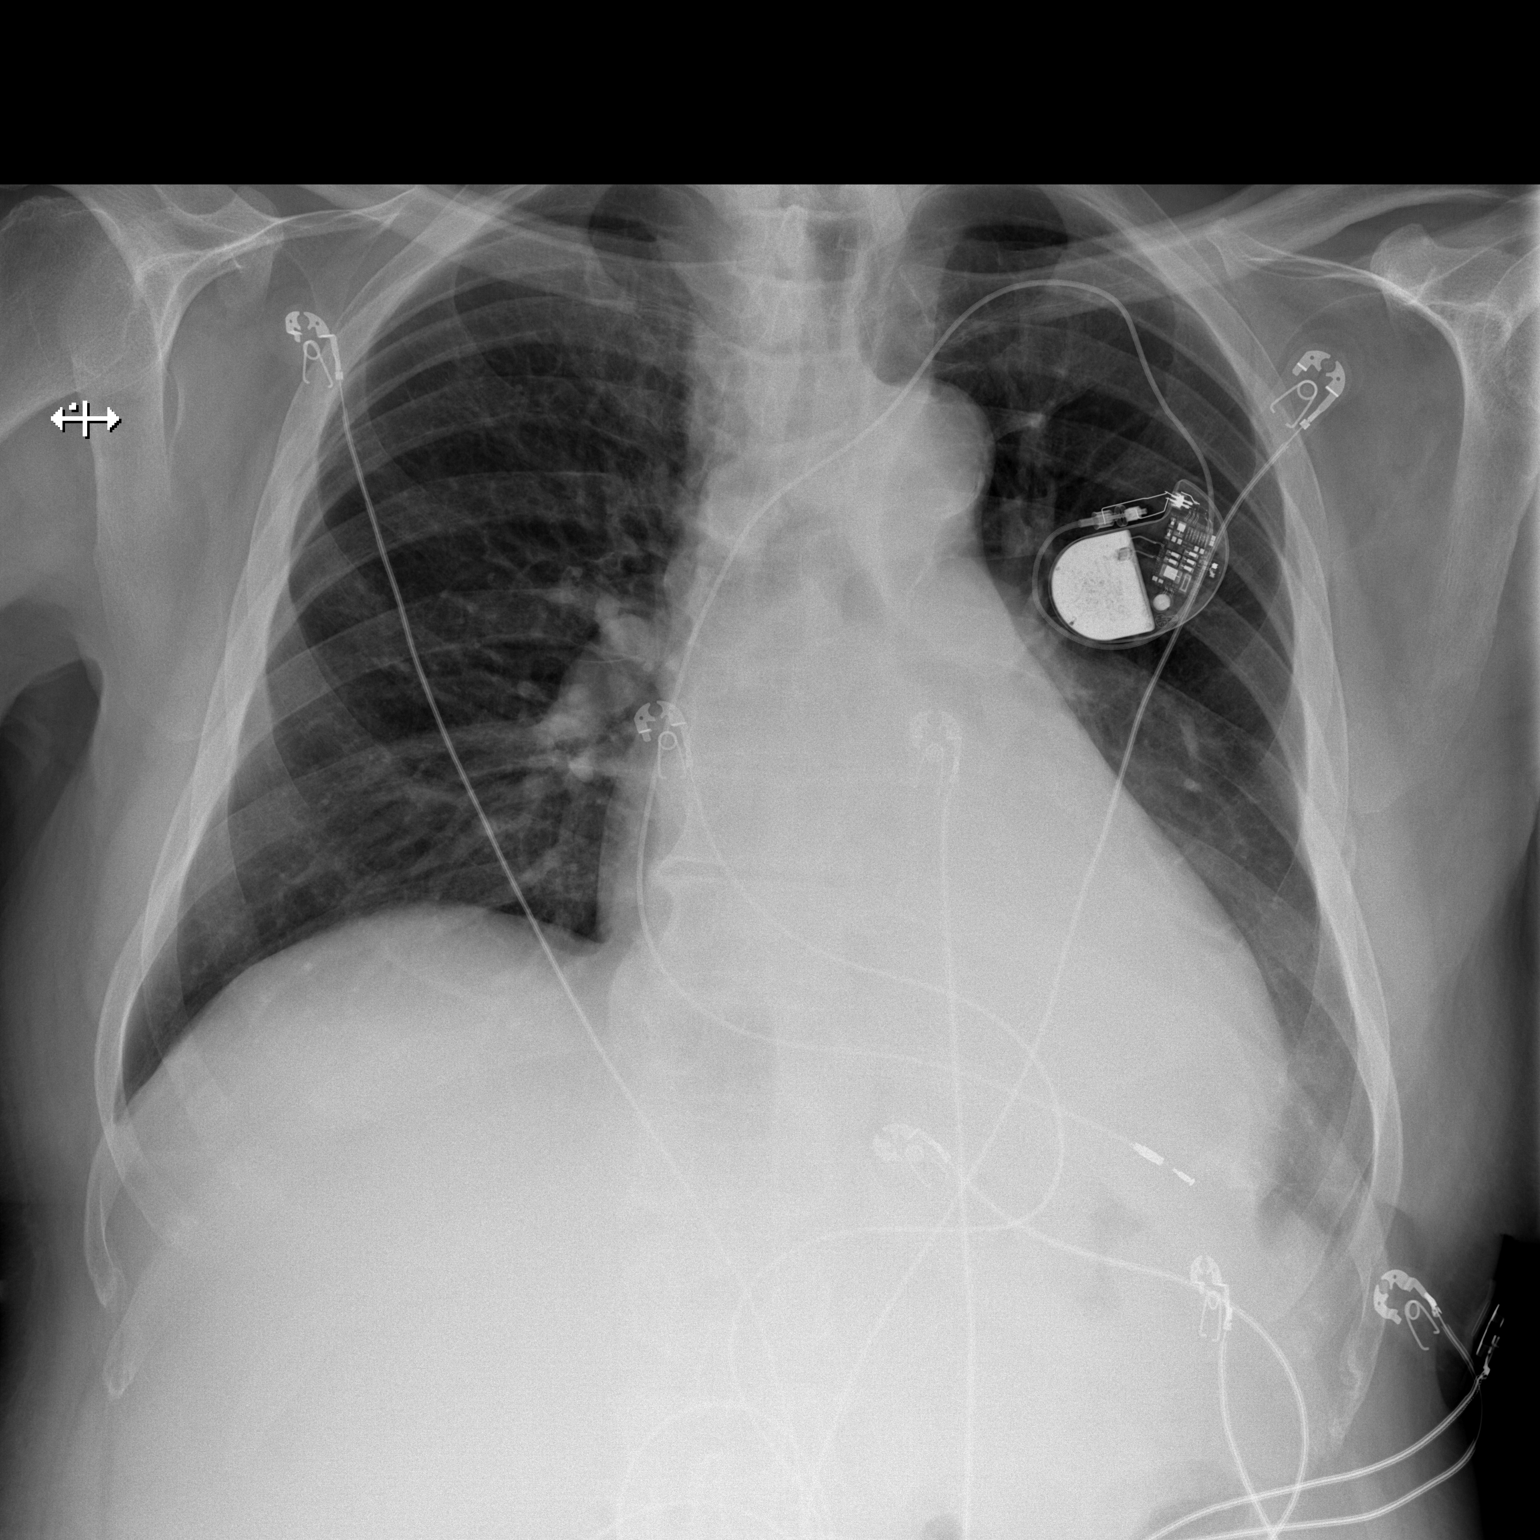

[w chest lat]
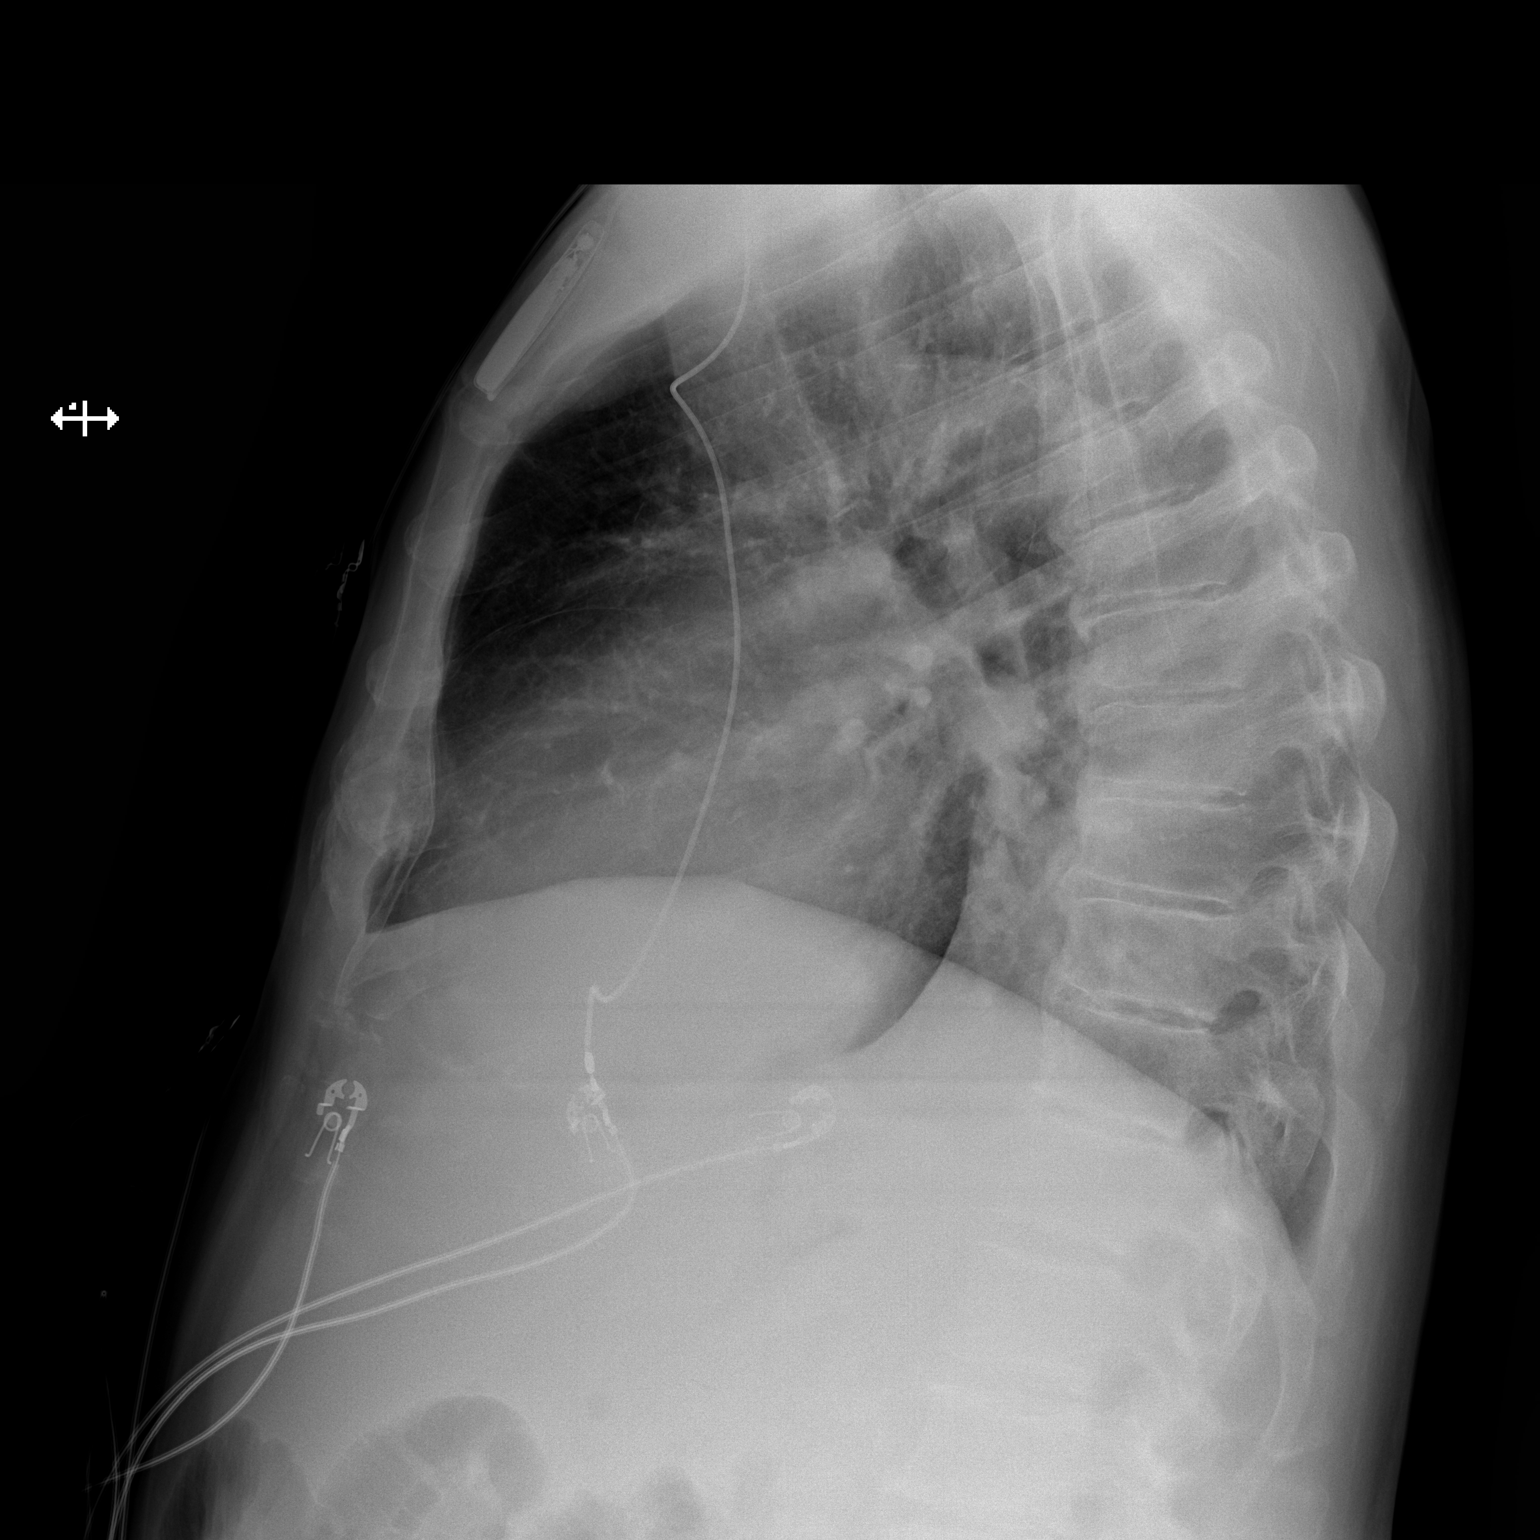

[2 of 2 positions shown; findings below may reference images not displayed]

FINDINGS: Mild right hemidiaphragm elevation. Single lead pacer tip at right
ventricle. Patient rotated minimally left. Midline trachea. Mild
cardiomegaly. Atherosclerosis in the transverse aorta. No pleural
effusion or pneumothorax. Dense left lower lobe opacity again
identified, similar. Cutoff of the left lower lobe bronchus.
IMPRESSION: No change since 08/22/2014.

Left lower lobe opacity, better evaluated on 08/31/2014 CT.

Cardiomegaly and atherosclerosis.

## 2016-09-03 IMAGING — CT CT HEAD W/O CM
2 series · 16 of 30 positions shown, 20 images · non-contrast
Comparison: 08/22/2014

CLINICAL DATA: Shortness of breath for 1 month. Double vision. A
biopsy has shown that the patient has cancer but the patient has not
been told what type.

EXAM:
CT HEAD WITHOUT CONTRAST
TECHNIQUE: Contiguous axial images were obtained from the base of the skull
through the vertex without intravenous contrast.

[Series 2: head w/o · axial · non-contrast · 0.43mm/px · z∈[+1387,+1517]mm · 13 of 32 slices shown, 17 images]
[im 3/32  brain]
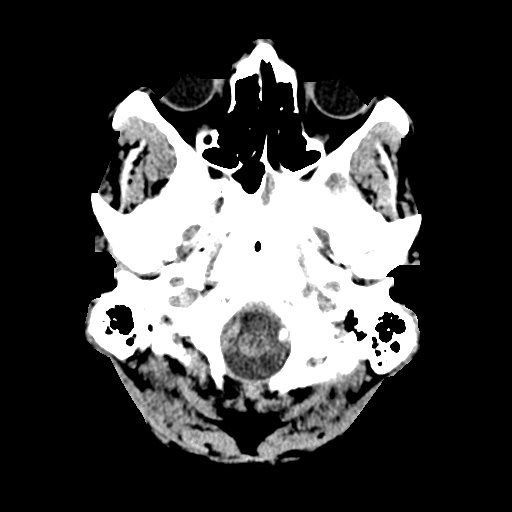
[im 3/32  bone]
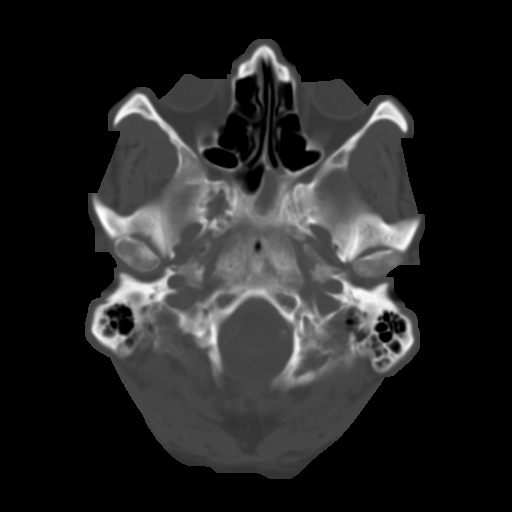
[im 5/32  brain]
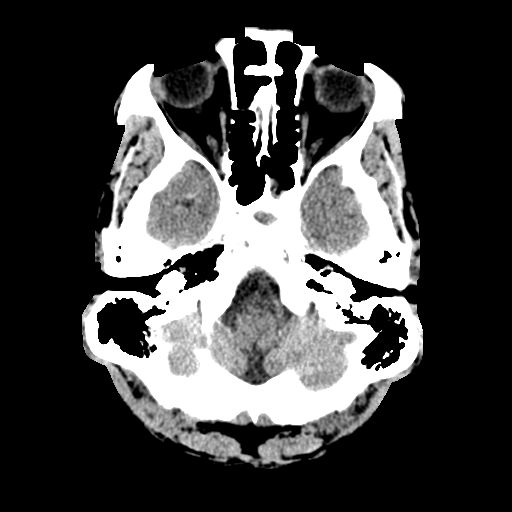
[im 7/32  brain]
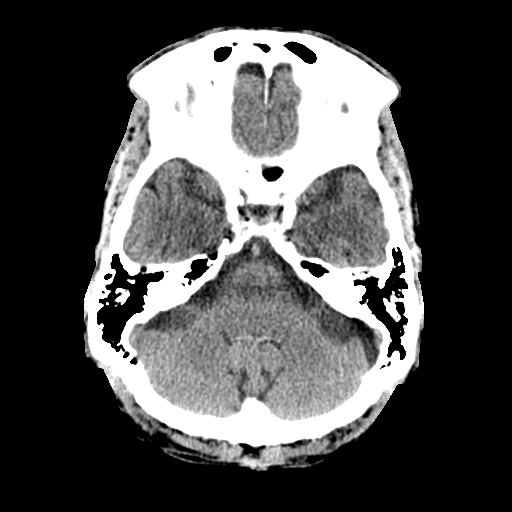
[im 9/32  brain]
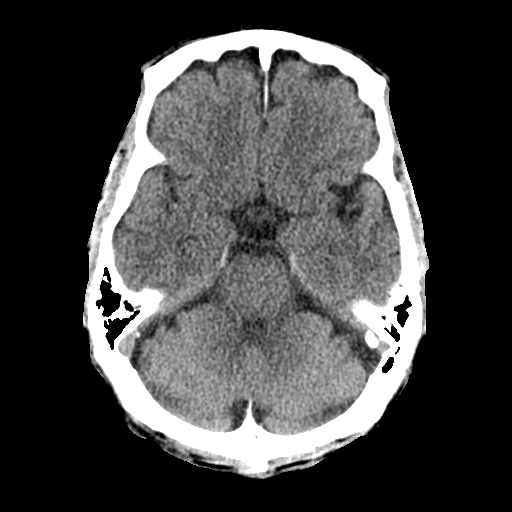
[im 12/32  brain]
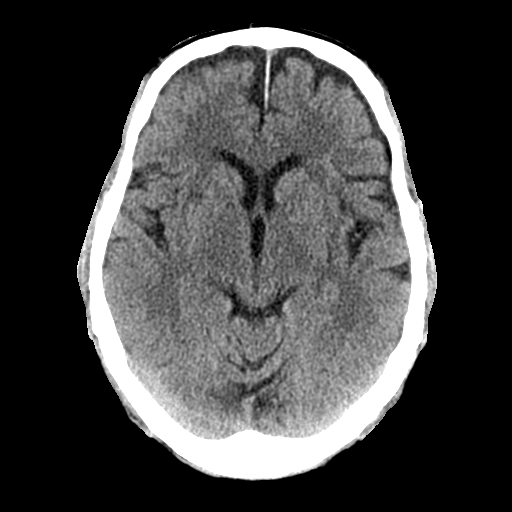
[im 12/32  bone]
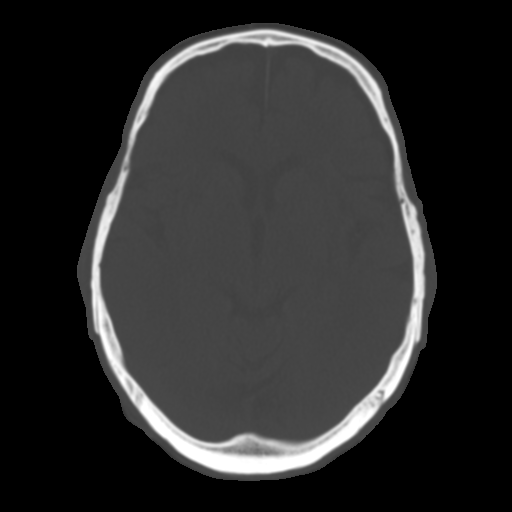
[im 14/32  brain]
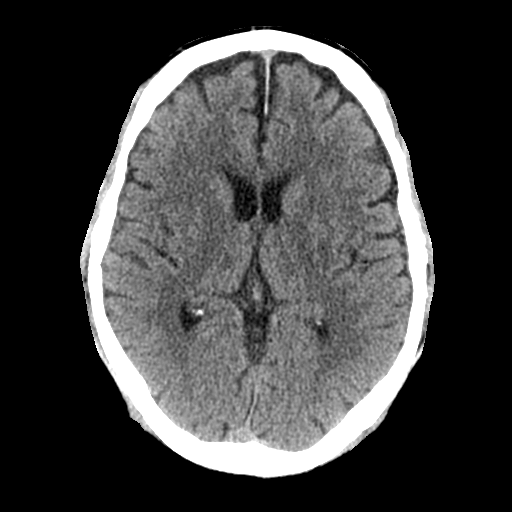
[im 16/32  brain]
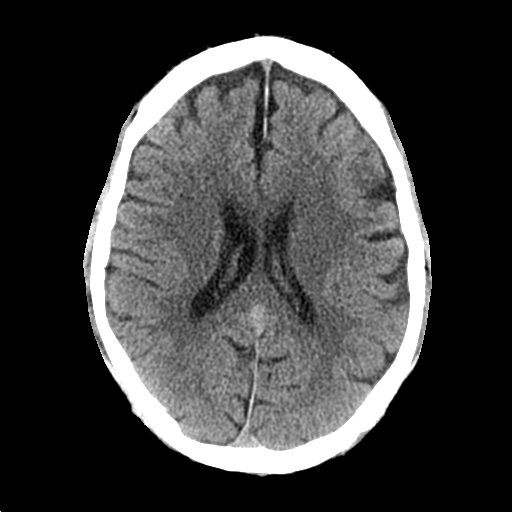
[im 18/32  brain]
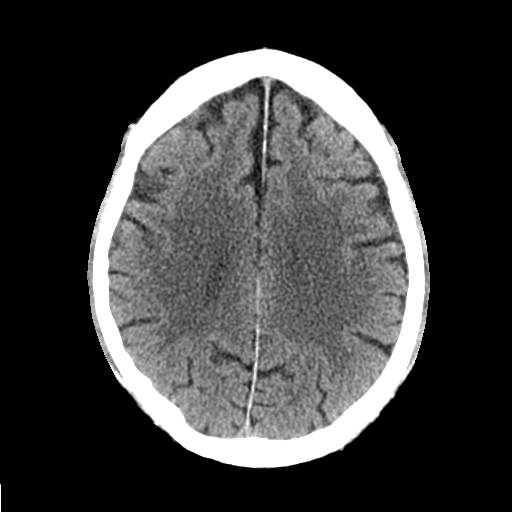
[im 20/32  brain]
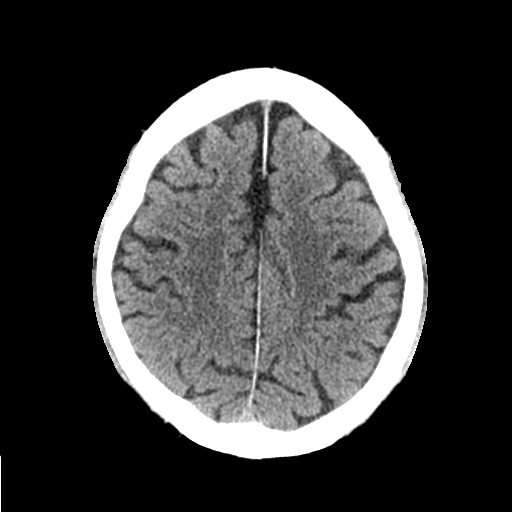
[im 20/32  bone]
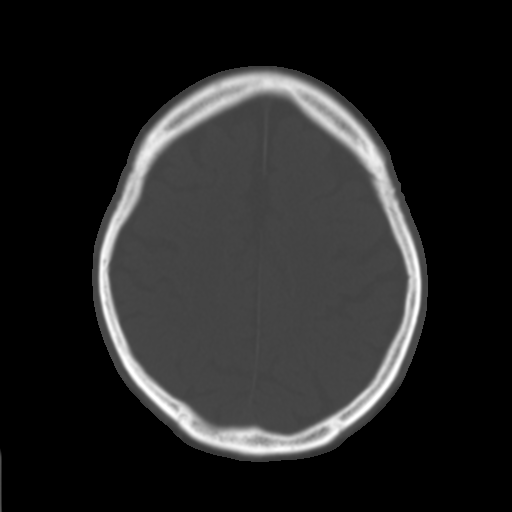
[im 23/32  brain]
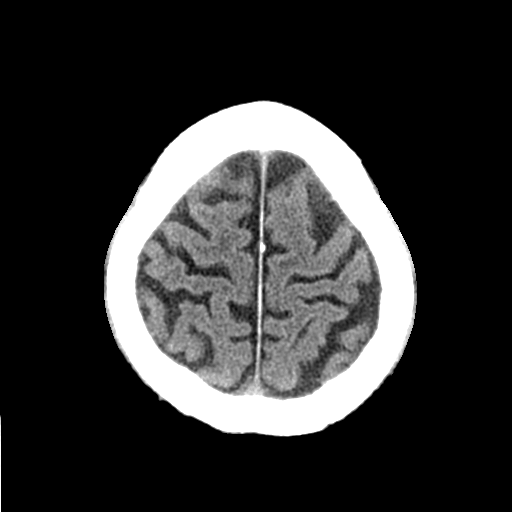
[im 25/32  brain]
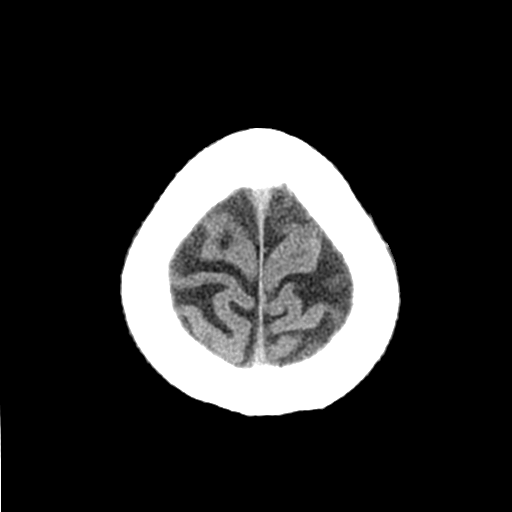
[im 27/32  brain]
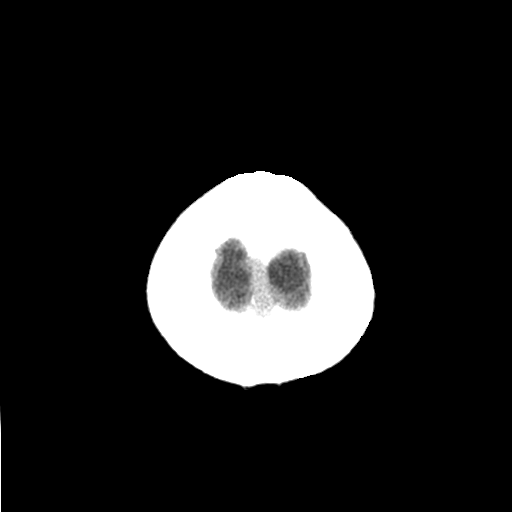
[im 29/32  brain]
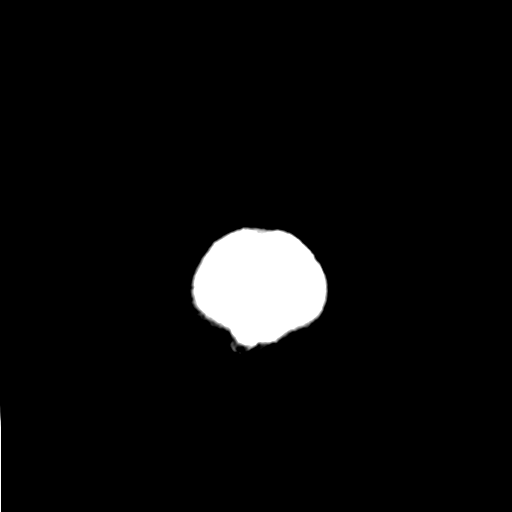
[im 29/32  bone]
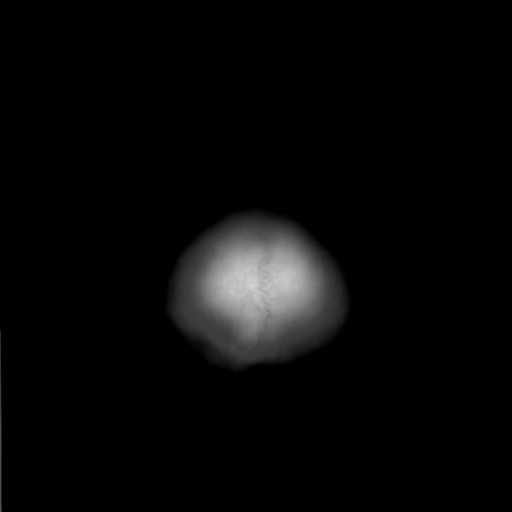

[Series 3: bone windows · axial · 0.43mm/px · z∈[+1387,+1432]mm · 3 of 32 slices shown]
[im 3/32  bone]
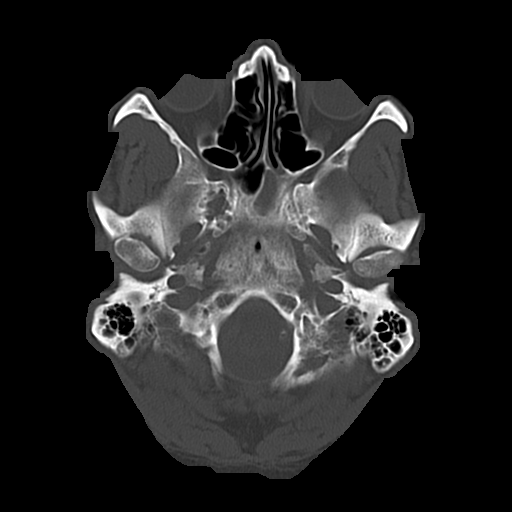
[im 7/32  bone]
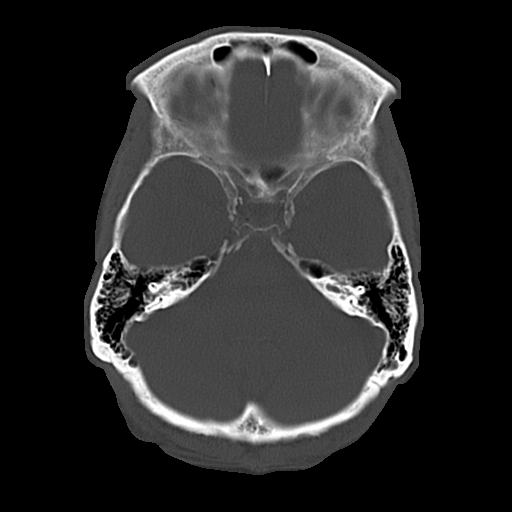
[im 12/32  bone]
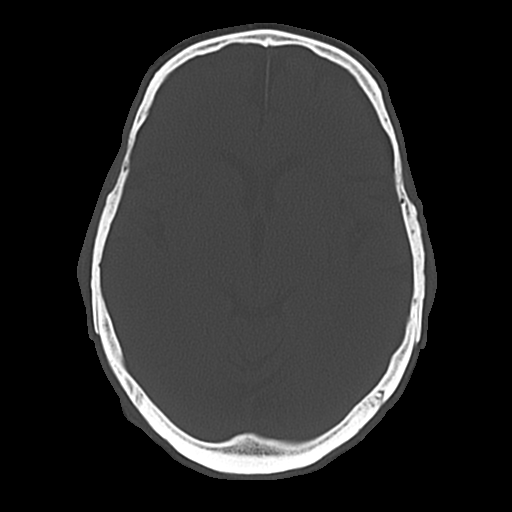

[16 of 30 positions shown; findings below may reference images not displayed]

FINDINGS: Ventricles and sulci appear symmetrical. No mass effect or midline
shift. No abnormal extra-axial fluid collections. Gray-white matter
junctions are distinct. Basal cisterns are not effaced. No evidence
of acute intracranial hemorrhage. No depressed skull fractures.
Retention cysts in the sphenoid sinus. Mastoid air cells are not
opacified.
IMPRESSION: No acute intracranial abnormalities.
